# Patient Record
Sex: Female | Born: 1990 | Hispanic: No | Marital: Married | Smoking: Never smoker
Health system: Southern US, Community
[De-identification: ages and names within clinical notes are randomized; demographics above are authoritative.]

## PROBLEM LIST (undated history)

## (undated) DIAGNOSIS — I82409 Acute embolism and thrombosis of unspecified deep veins of unspecified lower extremity: Secondary | ICD-10-CM

## (undated) DIAGNOSIS — O24419 Gestational diabetes mellitus in pregnancy, unspecified control: Secondary | ICD-10-CM

## (undated) DIAGNOSIS — D6851 Activated protein C resistance: Secondary | ICD-10-CM

## (undated) DIAGNOSIS — R011 Cardiac murmur, unspecified: Secondary | ICD-10-CM

## (undated) HISTORY — PX: NO PAST SURGERIES: SHX2092

## (undated) HISTORY — DX: Gestational diabetes mellitus in pregnancy, unspecified control: O24.419

## (undated) HISTORY — DX: Cardiac murmur, unspecified: R01.1

## (undated) HISTORY — DX: Acute embolism and thrombosis of unspecified deep veins of unspecified lower extremity: I82.409

---

## 2015-07-10 DIAGNOSIS — I7300 Raynaud's syndrome without gangrene: Secondary | ICD-10-CM | POA: Diagnosis not present

## 2015-07-17 DIAGNOSIS — I7300 Raynaud's syndrome without gangrene: Secondary | ICD-10-CM | POA: Insufficient documentation

## 2015-09-25 DIAGNOSIS — Z3041 Encounter for surveillance of contraceptive pills: Secondary | ICD-10-CM | POA: Diagnosis not present

## 2016-01-22 DIAGNOSIS — L819 Disorder of pigmentation, unspecified: Secondary | ICD-10-CM | POA: Diagnosis not present

## 2016-01-22 DIAGNOSIS — H02059 Trichiasis without entropian unspecified eye, unspecified eyelid: Secondary | ICD-10-CM | POA: Diagnosis not present

## 2016-01-22 DIAGNOSIS — Z111 Encounter for screening for respiratory tuberculosis: Secondary | ICD-10-CM | POA: Diagnosis not present

## 2016-01-22 DIAGNOSIS — N926 Irregular menstruation, unspecified: Secondary | ICD-10-CM | POA: Diagnosis not present

## 2016-01-27 DIAGNOSIS — H02816 Retained foreign body in left eye, unspecified eyelid: Secondary | ICD-10-CM | POA: Diagnosis not present

## 2016-01-29 DIAGNOSIS — N926 Irregular menstruation, unspecified: Secondary | ICD-10-CM | POA: Diagnosis not present

## 2016-02-08 DIAGNOSIS — N912 Amenorrhea, unspecified: Secondary | ICD-10-CM | POA: Diagnosis not present

## 2016-02-08 DIAGNOSIS — N926 Irregular menstruation, unspecified: Secondary | ICD-10-CM | POA: Diagnosis not present

## 2016-02-15 DIAGNOSIS — N926 Irregular menstruation, unspecified: Secondary | ICD-10-CM | POA: Diagnosis not present

## 2016-08-02 ENCOUNTER — Ambulatory Visit
Admission: EM | Admit: 2016-08-02 | Discharge: 2016-08-02 | Disposition: A | Discharge status: Hospice | Payer: 59 | Attending: Family Medicine | Admitting: Family Medicine

## 2016-08-02 ENCOUNTER — Emergency Department
Admission: EM | Admit: 2016-08-02 | Discharge: 2016-08-02 | Disposition: A | Discharge status: Home / Self Care | Payer: 59 | Attending: Emergency Medicine | Admitting: Emergency Medicine

## 2016-08-02 ENCOUNTER — Encounter: Payer: Self-pay | Admitting: *Deleted

## 2016-08-02 ENCOUNTER — Encounter: Payer: Self-pay | Admitting: Emergency Medicine

## 2016-08-02 ENCOUNTER — Ambulatory Visit (INDEPENDENT_AMBULATORY_CARE_PROVIDER_SITE_OTHER)
Admit: 2016-08-02 | Discharge: 2016-08-02 | Disposition: A | Discharge status: Home / Self Care | Payer: 59 | Attending: Emergency Medicine | Admitting: Emergency Medicine

## 2016-08-02 DIAGNOSIS — M79662 Pain in left lower leg: Secondary | ICD-10-CM | POA: Diagnosis not present

## 2016-08-02 DIAGNOSIS — R2242 Localized swelling, mass and lump, left lower limb: Secondary | ICD-10-CM | POA: Diagnosis not present

## 2016-08-02 DIAGNOSIS — I82409 Acute embolism and thrombosis of unspecified deep veins of unspecified lower extremity: Secondary | ICD-10-CM | POA: Diagnosis not present

## 2016-08-02 DIAGNOSIS — I82492 Acute embolism and thrombosis of other specified deep vein of left lower extremity: Secondary | ICD-10-CM | POA: Diagnosis not present

## 2016-08-02 DIAGNOSIS — I82432 Acute embolism and thrombosis of left popliteal vein: Secondary | ICD-10-CM

## 2016-08-02 DIAGNOSIS — M79605 Pain in left leg: Secondary | ICD-10-CM | POA: Diagnosis present

## 2016-08-02 LAB — CBC
HCT: 38.9 % (ref 35.0–47.0)
HEMOGLOBIN: 13.3 g/dL (ref 12.0–16.0)
MCH: 29.7 pg (ref 26.0–34.0)
MCHC: 34.1 g/dL (ref 32.0–36.0)
MCV: 86.9 fL (ref 80.0–100.0)
Platelets: 189 10*3/uL (ref 150–440)
RBC: 4.48 MIL/uL (ref 3.80–5.20)
RDW: 13.6 % (ref 11.5–14.5)
WBC: 11.4 10*3/uL — ABNORMAL HIGH (ref 3.6–11.0)

## 2016-08-02 LAB — APTT: aPTT: <24 seconds — ABNORMAL LOW (ref 24–36)

## 2016-08-02 LAB — BASIC METABOLIC PANEL
Anion gap: 9 (ref 5–15)
BUN: 7 mg/dL (ref 6–20)
CHLORIDE: 106 mmol/L (ref 101–111)
CO2: 23 mmol/L (ref 22–32)
CREATININE: 0.74 mg/dL (ref 0.44–1.00)
Calcium: 8.9 mg/dL (ref 8.9–10.3)
GFR calc Af Amer: >60 mL/min (ref >60)
GFR calc non Af Amer: >60 mL/min (ref >60)
GLUCOSE: 92 mg/dL (ref 65–99)
Potassium: 3.9 mmol/L (ref 3.5–5.1)
SODIUM: 138 mmol/L (ref 135–145)

## 2016-08-02 LAB — POCT PREGNANCY, URINE: Preg Test, Ur: NEGATIVE

## 2016-08-02 LAB — PROTIME-INR
INR: 0.94
PROTHROMBIN TIME: 12.6 s (ref 11.4–15.2)

## 2016-08-02 MED ORDER — APIXABAN 5 MG PO TABS: 5.0000 mg | ORAL_TABLET | Freq: Two times a day (BID) | ORAL | 1 refills | Status: DC

## 2016-08-02 NOTE — ED Notes (Signed)
See triage note. Confirmed DVT to left leg, sent over from [facility].

## 2016-08-02 NOTE — ED Triage Notes (Signed)
Pt reports pain to left lower leg x1 week, sent here from [facility] with confirmed blood clot to left leg.

## 2016-08-02 NOTE — ED Triage Notes (Signed)
Left calf, knee, and foot pain x1 week. Denies injury, gradual onset.

## 2016-08-02 NOTE — ED Provider Notes (Signed)
[facility] Emergency Department Provider Note   ____________________________________________    I have reviewed the triage vital signs and the nursing notes.   HISTORY  Chief Complaint Leg Pain     HPI Hannah Patterson is a 26 y.o. female who presents with complaints of left calf pain and swelling. Patient reports 2-3 days of swelling. Was seen in urgent care, had an ultrasound which demonstrates popliteal DVT. No recent travel. She is on hormonal birth control. No shortness of breath or pleurisy. No history of DVT. No trauma. No active medical problems. No history of ICH.   History reviewed. No pertinent past medical history.  There are no active problems to display for this patient.   No past surgical history on file.  Prior to Admission medications   Medication Sig Start Date End Date Taking? Authorizing Provider  apixaban (ELIQUIS) 5 MG TABS tablet Take 1 tablet (5 mg total) by mouth 2 (two) times daily. Take 2 tablets by mouth twice daily for 7 days. Then, 1 tablet by mouth twice daily until discontinued by primary care provider 08/02/16   Jene Every, MD     Allergies Patient has no known allergies.  No family history on file.  Social History Social History  Substance Use Topics  . Smoking status: Never Smoker  . Smokeless tobacco: Never Used  . Alcohol use No    Review of Systems  Constitutional: No fever/chills  Cardiovascular: Denies chest pain. Respiratory: Denies shortness of breath.No pleurisy Gastrointestinal: No abdominal pain.  No nausea, no vomiting.    Musculoskeletal: Calf pain as above Skin: Negative for rash.   10-point ROS otherwise negative.  ____________________________________________   PHYSICAL EXAM:  VITAL SIGNS: ED Triage Vitals [08/02/16 1243]  Enc Vitals Group     BP (!) 142/88     Pulse Rate 90     Resp 14     Temp 97.5 F (36.4 C)     Temp Source Oral     SpO2 100 %     Weight  200 lb (90.7 kg)     Height 5' (1.524 m)     Head Circumference      Peak Flow      Pain Score 3     Pain Loc      Pain Edu?      Excl. in GC?     Constitutional: Alert and oriented. No acute distress. Pleasant and interactive Eyes: Conjunctivae are normal.   Nose: No congestion/rhinnorhea.  Cardiovascular: Normal rate, regular rhythm. Grossly normal heart sounds.  Good peripheral circulation. Respiratory: Normal respiratory effort.  No retractions. Lungs CTAB. Gastrointestinal: Soft and nontender. No distention.  No CVA tenderness. Genitourinary: deferred Musculoskeletal: Mild swelling left calf, mild tenderness to palpation, no significant erythema. 2+ distal pulses Warm and well perfused Neurologic:  Normal speech and language. No gross focal neurologic deficits are appreciated.  Skin:  Skin is warm, dry and intact. No rash noted. Psychiatric: Mood and affect are normal. Speech and behavior are normal.  ____________________________________________   LABS (all labs ordered are listed, but only abnormal results are displayed)  Labs Reviewed  CBC - Abnormal; Notable for the following:       Result Value   WBC 11.4 (*)    All other components within normal limits  APTT - Abnormal; Notable for the following:    aPTT <24 (*)    All other components within normal limits  BASIC METABOLIC PANEL  PROTIME-INR  POCT PREGNANCY, URINE   ____________________________________________  EKG  None ____________________________________________  RADIOLOGY  Reviewed ultrasound and confirmed popliteal DVT ____________________________________________   PROCEDURES  Procedure(s) performed: No    Critical Care performed: No ____________________________________________   INITIAL IMPRESSION / ASSESSMENT AND PLAN / ED COURSE  Pertinent labs & imaging results that were available during my care of the patient were reviewed by me and considered in my medical decision making (see  chart for details).  Lab work is reassuring. Patient has is negative. We will start the patient on eliquis as no contraindications. Had extensive discussion with patient and family regarding blood thinners and the risks, they feel comfortable after our discussion. Informed patient not to attempt to get pregnant while on eliquis and to discuss with PCP alternative forms of birth control. ____________________________________________   FINAL CLINICAL IMPRESSION(S) / ED DIAGNOSES  Final diagnoses:  Acute deep vein thrombosis (DVT) of popliteal vein of left lower extremity (HCC)      NEW MEDICATIONS STARTED DURING THIS VISIT:  Discharge Medication List as of 08/02/2016  1:59 PM    START taking these medications   Details  apixaban (ELIQUIS) 5 MG TABS tablet Take 1 tablet (5 mg total) by mouth 2 (two) times daily. Take 2 tablets by mouth twice daily for 7 days. Then, 1 tablet by mouth twice daily until discontinued by primary care provider, Starting Tue 08/02/2016, Print         Note:  This document was prepared using Dragon voice recognition software and may include unintentional dictation errors.    Jene Everyobert Tecia Cinnamon, MD 08/02/16 [phone]

## 2016-08-02 NOTE — ED Provider Notes (Signed)
CSN: [id number]     Arrival date & time 08/02/16  4098 History   First MD Initiated Contact with Patient 08/02/16 [phone]     Chief Complaint  Patient presents with  . Knee Pain  . Leg Pain  . Foot Pain   (Consider location/radiation/quality/duration/timing/severity/associated sxs/prior Treatment) HPI  26 year old female who presents with one-week history of left calf knee and foot pain. She denies any injury. It has been a gradual onset. She noticed it one night while eating dinner and had her legs tucked under the chair slightly which is her normal way of sitting. At that time it has affected her foot and ankle and she's had some numbness and tingling. She has no back pain. She denies any long car trips or airplane rides. 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 He does take birth control pills.        History reviewed. No pertinent past medical history. History reviewed. No pertinent surgical history. History reviewed. No pertinent family history. Social History  Substance Use Topics  . Smoking status: Never Smoker  . Smokeless tobacco: Never Used  . Alcohol use No   OB History    No data available     Review of Systems  Constitutional: Positive for activity change. Negative for chills and fatigue.  Musculoskeletal: Positive for myalgias.  All other systems reviewed and are negative.   Allergies  Patient has no known allergies.  Home Medications   Prior to Admission medications   Medication Sig Start Date End Date Taking? Authorizing Provider  Norgestim-Eth Estrad Triphasic (TRI-LO-MARZIA PO) Take by mouth.   Yes Historical Provider, MD   Meds Ordered and Administered this Visit  Medications - No data to display  BP 133/84 (BP Location: Left Arm)   Pulse 93   Temp 98.1 F (36.7 C) (Oral)   Resp 16   Ht 5' (1.524 m)   Wt 200 lb (90.7 kg)   LMP 07/28/2016 (Exact Date)   SpO2 100%   BMI 39.06 kg/m  No data  found.   Physical Exam  Constitutional: She is oriented to person, place, and time. She appears well-developed and well-nourished. No distress.  HENT:  Head: Normocephalic and atraumatic.  Eyes: Pupils are equal, round, and reactive to light.  Neck: Normal range of motion.  Pulmonary/Chest: Effort normal and breath sounds normal. No respiratory distress. She has no wheezes. She has no rales.  Musculoskeletal:  Calf circumference is 46cm on the left 43cm on the right. Patient has tenderness palpation of the posterior calf at midline. Extends up to the knee.  Neurological: She is alert and oriented to person, place, and time.  Skin: Skin is warm and dry. She is not diaphoretic.  Psychiatric: She has a normal mood and affect. Her behavior is normal. Judgment and thought content normal.  Nursing note and vitals reviewed.   Urgent Care Course     Procedures (including critical care time)  Labs Review Labs Reviewed - No data to display  Imaging Review No results found.   Visual Acuity Review  Right Eye Distance:   Left Eye Distance:   Bilateral Distance:    Right Eye Near:   Left Eye Near:    Bilateral Near:         MDM   1. DVT (deep venous thrombosis) (HCC)    Reviewed the ultrasound report with the patient and her father we will refer them over to [facility] emergency  department for further evaluation and treatment. All questions were answered.Patient left in privately owned vehicle in stable condition.    Lutricia FeilWilliam P Renold Kozar, PA-C 08/02/16 1212

## 2016-08-04 ENCOUNTER — Telehealth: Payer: Self-pay

## 2016-08-04 NOTE — Telephone Encounter (Signed)
Courtesy call back completed today for patient's recent visit at [facility]. Patient did not answer, left message on machine to call back with any questions or concerns.   

## 2016-08-05 DIAGNOSIS — Z3009 Encounter for other general counseling and advice on contraception: Secondary | ICD-10-CM | POA: Diagnosis not present

## 2016-08-05 DIAGNOSIS — Z7901 Long term (current) use of anticoagulants: Secondary | ICD-10-CM | POA: Diagnosis not present

## 2016-08-05 DIAGNOSIS — I82432 Acute embolism and thrombosis of left popliteal vein: Secondary | ICD-10-CM | POA: Diagnosis not present

## 2016-08-17 ENCOUNTER — Inpatient Hospital Stay: Payer: 59

## 2016-08-17 ENCOUNTER — Inpatient Hospital Stay: Payer: 59 | Attending: Hematology and Oncology | Admitting: Hematology and Oncology

## 2016-08-17 ENCOUNTER — Encounter: Payer: Self-pay | Admitting: Hematology and Oncology

## 2016-08-17 VITALS — BP 122/83 | HR 80 | Temp 98.1°F | Resp 18 | Wt 206.4 lb

## 2016-08-17 DIAGNOSIS — D6861 Antiphospholipid syndrome: Secondary | ICD-10-CM | POA: Insufficient documentation

## 2016-08-17 DIAGNOSIS — Z8249 Family history of ischemic heart disease and other diseases of the circulatory system: Secondary | ICD-10-CM | POA: Insufficient documentation

## 2016-08-17 DIAGNOSIS — Z7901 Long term (current) use of anticoagulants: Secondary | ICD-10-CM | POA: Insufficient documentation

## 2016-08-17 DIAGNOSIS — I82432 Acute embolism and thrombosis of left popliteal vein: Secondary | ICD-10-CM | POA: Diagnosis not present

## 2016-08-17 LAB — COMPREHENSIVE METABOLIC PANEL
ALT: 18 U/L (ref 14–54)
AST: 21 U/L (ref 15–41)
Albumin: 4.2 g/dL (ref 3.5–5.0)
Alkaline Phosphatase: 59 U/L (ref 38–126)
Anion gap: 8 (ref 5–15)
BUN: 6 mg/dL (ref 6–20)
CO2: 24 mmol/L (ref 22–32)
Calcium: 9 mg/dL (ref 8.9–10.3)
Chloride: 101 mmol/L (ref 101–111)
Creatinine, Ser: 0.67 mg/dL (ref 0.44–1.00)
GFR calc Af Amer: >60 mL/min (ref >60)
GFR calc non Af Amer: >60 mL/min (ref >60)
Glucose, Bld: 86 mg/dL (ref 65–99)
Potassium: 3.4 mmol/L — ABNORMAL LOW (ref 3.5–5.1)
Sodium: 133 mmol/L — ABNORMAL LOW (ref 135–145)
Total Bilirubin: 0.7 mg/dL (ref 0.3–1.2)
Total Protein: 7.7 g/dL (ref 6.5–8.1)

## 2016-08-17 LAB — CBC WITH DIFFERENTIAL/PLATELET
Basophils Absolute: 0.1 10*3/uL (ref 0–0.1)
Basophils Relative: 1 %
Eosinophils Absolute: 0.2 10*3/uL (ref 0–0.7)
Eosinophils Relative: 3 %
HCT: 41.7 % (ref 35.0–47.0)
Hemoglobin: 13.8 g/dL (ref 12.0–16.0)
Lymphocytes Relative: 27 %
Lymphs Abs: 2 10*3/uL (ref 1.0–3.6)
MCH: 28.9 pg (ref 26.0–34.0)
MCHC: 33.1 g/dL (ref 32.0–36.0)
MCV: 87.3 fL (ref 80.0–100.0)
Monocytes Absolute: 0.5 10*3/uL (ref 0.2–0.9)
Monocytes Relative: 7 %
Neutro Abs: 4.8 10*3/uL (ref 1.4–6.5)
Neutrophils Relative %: 62 %
Platelets: 300 10*3/uL (ref 150–440)
RBC: 4.77 MIL/uL (ref 3.80–5.20)
RDW: 13.7 % (ref 11.5–14.5)
WBC: 7.6 10*3/uL (ref 3.6–11.0)

## 2016-08-17 NOTE — Progress Notes (Signed)
{  Patient here today as new evaluation regarding DVT in LLE.  Referred by Dr. Harrington Challengerhies.  Patient states her maternal grandmother has DVTs and her paternal grandmother had several miscarriages.

## 2016-08-17 NOTE — Progress Notes (Signed)
[facility] day:  08/17/2016  Chief Complaint: Hannah Patterson is a 26 y.o. female with a left lower extremity DVT who is referred in consultation by Dr. Raechel Ache for assessment and management.  HPI:  The patient notes left calf pain in 05/2016 like she "pulled a muscle". She took Tylenol. On 07/26/2016, she states that the pain was worse. She used Tylenol, heating pad, and stretching. On 08/02/2016 while at work, pain was excruciating. She denied any trauma.  She went to the urgent care.   Left lower extremity duplex on 08/02/2016 revealed an acute deep venous thrombosis involving the left popliteal and posterior tibial veins.  She was transferred to the [facility] ER.  She was started on Eliquis.  She took 10 mg BID x 1 week and then 5 mg BID.  She denies any excess bruising, but notes that her menses lasted longer.  Labs on 08/02/2016 revealed a hematocrit 38.6, hemoglobin 13.3, MCV 86.9, platelets 189,000 and white count 11,400.  Basic metabolic panel included a creatinine of 0.74.  PT was 12.6 (INR 0.94) and PTT < 24.  She denies any immobility, long car or plane rides.  She denies any dehydration.  She was on birth control pills for 2 years.  She is now off.  She denies any B symptoms.  She denies any medical problems except for Raynaud's.  She does not smoke.  Family history is notable for a maternal grandmother who died of pulmonary embolism in her 104s.  Her paternal grandmothergrandmother had a miscarriage 2. A half maternal aunt has lupus.   Past Medical History:  Diagnosis Date  . DVT (deep venous thrombosis) ([place])   . Heart murmur     History reviewed. No pertinent surgical history.  Family History  Problem Relation Age of Onset  . Cancer Maternal Aunt     Social History:  reports that she has never smoked. She has never used smokeless tobacco. She reports that she does not drink alcohol. Her drug history is not on file.  She is a  Animal nutritionist for [employer].  She lives in [place].  The patient is alone today.  Allergies: No Known Allergies  Current Medications: Current Outpatient Prescriptions  Medication Sig Dispense Refill  . apixaban (ELIQUIS) 5 MG TABS tablet Take 1 tablet (5 mg total) by mouth 2 (two) times daily. Take 2 tablets by mouth twice daily for 7 days. Then, 1 tablet by mouth twice daily until discontinued by primary care provider 60 tablet 1   No current facility-administered medications for this visit.     Review of Systems:  GENERAL:  Feels good.  Active.  No fevers, sweats or weight loss. PERFORMANCE STATUS (ECOG):  0 HEENT:  No visual changes, runny nose, sore throat, mouth sores or tenderness. Lungs: No shortness of breath or cough.  No hemoptysis. Cardiac:  No chest pain, palpitations, orthopnea, or PND. GI:  No nausea, vomiting, diarrhea, constipation, melena or hematochezia. Heavy menses x 1. GU:  No urgency, frequency, dysuria, or hematuria. Musculoskeletal:  No back pain.  No joint pain.  No muscle tenderness. Extremities:  Left lower extremity swelling.  No pain. Skin:  No rashes or skin changes. Neuro:  Headache.  No numbness or weakness, balance or coordination issues. Endocrine:  No diabetes, thyroid issues, hot flashes or night sweats. Psych:  No mood changes, depression or anxiety. Pain:  No focal pain. Review of systems:  All other systems reviewed  and found to be negative.  Physical Exam: Blood pressure 122/83, pulse 80, temperature 98.1 F (36.7 C), temperature source Tympanic, resp. rate 18, weight 206 lb 5.6 oz (93.6 kg), last menstrual period 08/02/2016. GENERAL:  Well developed, well nourished, heavyset woman sitting comfortably in the exam room in no acute distress. MENTAL STATUS:  Alert and oriented to person, place and time. HEAD:  Long dark brown hair.  Normocephalic, atraumatic, face symmetric, no Cushingoid features. EYES:  Brown eyes.  Pupils equal round  and reactive to light and accomodation.  No conjunctivitis or scleral icterus. ENT:  Oropharynx clear without lesion.  Tongue normal. Mucous membranes moist.  RESPIRATORY:  Clear to auscultation without rales, wheezes or rhonchi. CARDIOVASCULAR:  Regular rate and rhythm without murmur, rub or gallop. ABDOMEN:  Soft, non-tender, with active bowel sounds, and no hepatosplenomegaly.  No masses. SKIN:  No rashes, ulcers or lesions. EXTREMITIES: 4 cm circumferential difference between left and right calf.  No skin discoloration or tenderness.  No palpable cords. LYMPH NODES: No palpable cervical, supraclavicular, axillary or inguinal adenopathy  NEUROLOGICAL: Unremarkable. PSYCH:  Appropriate.   No visits with results within 3 Day(s) from this visit.  Latest known visit with results is:  Admission on 08/02/2016, Discharged on 08/02/2016  Component Date Value Ref Range Status  . WBC 08/02/2016 11.4* 3.6 - 11.0 K/uL Final  . RBC 08/02/2016 4.48  3.80 - 5.20 MIL/uL Final  . Hemoglobin 08/02/2016 13.3  12.0 - 16.0 g/dL Final  . HCT 08/02/2016 38.9  35.0 - 47.0 % Final  . MCV 08/02/2016 86.9  80.0 - 100.0 fL Final  . MCH 08/02/2016 29.7  26.0 - 34.0 pg Final  . MCHC 08/02/2016 34.1  32.0 - 36.0 g/dL Final  . RDW 08/02/2016 13.6  11.5 - 14.5 % Final  . Platelets 08/02/2016 189  150 - 440 K/uL Final  . Sodium 08/02/2016 138  135 - 145 mmol/L Final  . Potassium 08/02/2016 3.9  3.5 - 5.1 mmol/L Final  . Chloride 08/02/2016 106  101 - 111 mmol/L Final  . CO2 08/02/2016 23  22 - 32 mmol/L Final  . Glucose, Bld 08/02/2016 92  65 - 99 mg/dL Final  . BUN 08/02/2016 7  6 - 20 mg/dL Final  . Creatinine, Ser 08/02/2016 0.74  0.44 - 1.00 mg/dL Final  . Calcium 08/02/2016 8.9  8.9 - 10.3 mg/dL Final  . GFR calc non Af Amer 08/02/2016 >60  >60 mL/min Final  . GFR calc Af Amer 08/02/2016 >60  >60 mL/min Final   Comment: (NOTE) The eGFR has been calculated using the CKD EPI equation. This calculation has  not been validated in all clinical situations. eGFR's persistently <60 mL/min signify possible Chronic Kidney Disease.   . Anion gap 08/02/2016 9  5 - 15 Final  . Prothrombin Time 08/02/2016 12.6  11.4 - 15.2 seconds Final  . INR 08/02/2016 0.94   Final  . aPTT 08/02/2016 <24* 24 - 36 seconds Final  . Preg Test, Ur 08/02/2016 NEGATIVE  NEGATIVE Final   Comment:        THE SENSITIVITY OF THIS METHODOLOGY IS >24 mIU/mL     Assessment:  Mayreli Robyn Vanosdol is a 26 y.o. female with a left lower extremity DVT while on birth control pills.  Left lower extremity duplex on 08/02/2016 revealed an acute deep venous thrombosis involving the left popliteal and posterior tibial veins.  She is on Eliquis.  She denies any immobility, long car or plane  rides.  She denies any dehydration.  She was on birth control pills for 2 years (discontinued). She does not smoke.  There is a family history of thrombosis (maternal grandmother, paternal grandmother).  A maternal aunt has lupus.  Labs on 08/02/2016 revealed a a normal CBC, BMP, PT.  PTT was < 24 (low).  Symptomatically, she denies any B symptoms.  She described Raynaud's symptoms in 05/2016.  Exam reveals left lower extremity edema.  Plan: 1.  Discuss diagnosis and management of DVT.  Discuss risk factors for thrombosis.  She was on birth control pills, now discontinued.  She is overweight.  Discuss risk factors for thrombosis as rocks in a basket. Enough rocks and the basket sinks.  Sinking represents thrombosis.  Discuss limiting risk factors that she can control.  Discuss a hypercoagulable work-up.    Discuss initial low PTT which may be the result of sample collection. Clotting factors may be increased or activated in vivo resulting in shortening of clotting times.  Discuss repeat PTT.  If short PTT persists on recollection, this may result in an increased risk of thrombosis, recurrent thrombosis, or miscarriage.  Discuss plan for initial  anticoagulation for 6 months unless work-up reveals an abnormality such as a lupus anticoagulant.  Discuss 2 positive tests 3 months apart for a lupus anticoagulant/antiphospholipid antibody syndrome.  Test may be affected by Eliquis.  2.  Labs today:  CBC with diff, CMP, Factor V Leiden, prothrombin gene mutation, PTT, lupus anticoagulant panel, beta-2 glycoprotein antibodies, anti-cardiolipin antibodies. 3.  Anticipate additional testing in future:  protein C, protein S, and ATIII. 4.  RTC in 1 month for MD assessment and labs (CBC with diff, BMP).   Lequita Asal, MD  08/17/2016, 3:21 PM

## 2016-08-18 LAB — APTT: aPTT: 36 seconds (ref 24–36)

## 2016-08-19 LAB — LUPUS ANTICOAGULANT PANEL
DRVVT: 63.4 s — ABNORMAL HIGH (ref 0.0–47.0)
PTT Lupus Anticoagulant: 43.6 s (ref 0.0–51.9)

## 2016-08-19 LAB — CARDIOLIPIN ANTIBODIES, IGG, IGM, IGA
Anticardiolipin IgA: <9 APL U/mL (ref 0–11)
Anticardiolipin IgG: <9 GPL U/mL (ref 0–14)
Anticardiolipin IgM: 14 MPL U/mL — ABNORMAL HIGH (ref 0–12)

## 2016-08-19 LAB — BETA-2-GLYCOPROTEIN I ABS, IGG/M/A
Beta-2 Glyco I IgG: <9 GPI IgG units (ref 0–20)
Beta-2-Glycoprotein I IgA: <9 GPI IgA units (ref 0–25)
Beta-2-Glycoprotein I IgM: <9 GPI IgM units (ref 0–32)

## 2016-08-19 LAB — DRVVT MIX: dRVVT Mix: 47.4 s — ABNORMAL HIGH (ref 0.0–47.0)

## 2016-08-19 LAB — DRVVT CONFIRM: dRVVT Confirm: 1.2 ratio (ref 0.8–1.2)

## 2016-08-22 LAB — PROTHROMBIN GENE MUTATION

## 2016-08-23 LAB — FACTOR 5 LEIDEN

## 2016-08-25 DIAGNOSIS — D6851 Activated protein C resistance: Secondary | ICD-10-CM | POA: Insufficient documentation

## 2016-08-28 DIAGNOSIS — E669 Obesity, unspecified: Secondary | ICD-10-CM | POA: Diagnosis present

## 2016-09-13 ENCOUNTER — Telehealth: Payer: Self-pay | Admitting: *Deleted

## 2016-09-13 NOTE — Telephone Encounter (Signed)
Erroneous entry

## 2016-09-14 ENCOUNTER — Telehealth: Payer: Self-pay | Admitting: *Deleted

## 2016-09-14 ENCOUNTER — Inpatient Hospital Stay: Payer: 59

## 2016-09-14 ENCOUNTER — Inpatient Hospital Stay: Payer: 59 | Attending: Hematology and Oncology | Admitting: Hematology and Oncology

## 2016-09-14 ENCOUNTER — Ambulatory Visit: Payer: 59 | Admitting: Hematology and Oncology

## 2016-09-14 ENCOUNTER — Encounter: Payer: Self-pay | Admitting: Hematology and Oncology

## 2016-09-14 VITALS — BP 119/80 | HR 83 | Temp 98.8°F | Resp 18 | Wt 207.9 lb

## 2016-09-14 DIAGNOSIS — Z79899 Other long term (current) drug therapy: Secondary | ICD-10-CM | POA: Diagnosis not present

## 2016-09-14 DIAGNOSIS — Z7901 Long term (current) use of anticoagulants: Secondary | ICD-10-CM | POA: Insufficient documentation

## 2016-09-14 DIAGNOSIS — I82432 Acute embolism and thrombosis of left popliteal vein: Secondary | ICD-10-CM | POA: Diagnosis not present

## 2016-09-14 DIAGNOSIS — Z8249 Family history of ischemic heart disease and other diseases of the circulatory system: Secondary | ICD-10-CM | POA: Insufficient documentation

## 2016-09-14 DIAGNOSIS — D6851 Activated protein C resistance: Secondary | ICD-10-CM | POA: Insufficient documentation

## 2016-09-14 LAB — CBC WITH DIFFERENTIAL/PLATELET
Basophils Absolute: 0 10*3/uL (ref 0–0.1)
Basophils Relative: 1 %
Eosinophils Absolute: 0.3 10*3/uL (ref 0–0.7)
Eosinophils Relative: 5 %
HCT: 39.8 % (ref 35.0–47.0)
Hemoglobin: 13.4 g/dL (ref 12.0–16.0)
Lymphocytes Relative: 27 %
Lymphs Abs: 2 10*3/uL (ref 1.0–3.6)
MCH: 28.9 pg (ref 26.0–34.0)
MCHC: 33.6 g/dL (ref 32.0–36.0)
MCV: 86 fL (ref 80.0–100.0)
Monocytes Absolute: 0.6 10*3/uL (ref 0.2–0.9)
Monocytes Relative: 8 %
Neutro Abs: 4.3 10*3/uL (ref 1.4–6.5)
Neutrophils Relative %: 59 %
Platelets: 291 10*3/uL (ref 150–440)
RBC: 4.63 MIL/uL (ref 3.80–5.20)
RDW: 13.4 % (ref 11.5–14.5)
WBC: 7.3 10*3/uL (ref 3.6–11.0)

## 2016-09-14 LAB — BASIC METABOLIC PANEL
Anion gap: 6 (ref 5–15)
BUN: 8 mg/dL (ref 6–20)
CO2: 23 mmol/L (ref 22–32)
Calcium: 9 mg/dL (ref 8.9–10.3)
Chloride: 102 mmol/L (ref 101–111)
Creatinine, Ser: 0.62 mg/dL (ref 0.44–1.00)
GFR calc Af Amer: >60 mL/min (ref >60)
GFR calc non Af Amer: >60 mL/min (ref >60)
Glucose, Bld: 86 mg/dL (ref 65–99)
Potassium: 3.5 mmol/L (ref 3.5–5.1)
Sodium: 131 mmol/L — ABNORMAL LOW (ref 135–145)

## 2016-09-14 NOTE — Telephone Encounter (Signed)
Called patient and LVM that sodium is a little low.  Lab results have been sent to PCP for review.  Instructed patient to call PCP if she does not hear from them in a day or so.  Also advised her to eat something salty.

## 2016-09-14 NOTE — Progress Notes (Signed)
Patient here for lab results.

## 2016-09-14 NOTE — Telephone Encounter (Signed)
-----   Message from Rosey Bath, MD sent at 09/14/2016  3:54 PM EDT ----- Regarding: Please call patient.  FAX labs to PCP  Sodium a little low.  M  ----- Message ----- From: Interface, Lab In [place] Sent: 09/14/2016   2:59 PM To: Rosey Bath, MD

## 2016-09-14 NOTE — Progress Notes (Signed)
[facility] day:  09/14/2016  Chief Complaint: Hannah Patterson is a 26 y.o. female with a left lower extremity DVT who is seen for review of work-up and discussion regarding direction of therapy.  HPI:  The patient was last seen in the hematology clinic on 08/17/2016.  At that time, she was seen for initial consultation.  She had a left lower extremity DVT while on birth control pills.  She was on Eliquis.  Outside labs revealed a normal CBC.  PTT was low (< 24).  She underwent a hypercoagulable work-up.  The following labs were normal:  CBC with diff, CMP, PTT (36), lupus anticoagulant panel, beta-2 glycoprotein antibodies, and prothrombin gene mutation.  Factor V Leiden revealed a single R506Q mutation.  Anticardiolipin IgM was indeterminate (14).  During the interim, she notes no more pain.  Her leg is still swollen.  She is taking Eliquis 5 mg BID.  She states that she is going to [place] in 12/2016.   Past Medical History:  Diagnosis Date  . DVT (deep venous thrombosis) ([place])   . Heart murmur     History reviewed. No pertinent surgical history.  Family History  Problem Relation Age of Onset  . Cancer Maternal Aunt     Social History:  reports that she has never smoked. She has never used smokeless tobacco. She reports that she does not drink alcohol. Her drug history is not on file.  She is a Animal nutritionist for [employer].  She lives in [place].  She is going to [place] in 12/2016.  The patient is accompanied by her husband and mother-in-law today.  Allergies: No Known Allergies  Current Medications: Current Outpatient Prescriptions  Medication Sig Dispense Refill  . apixaban (ELIQUIS) 5 MG TABS tablet Take 1 tablet (5 mg total) by mouth 2 (two) times daily. Take 2 tablets by mouth twice daily for 7 days. Then, 1 tablet by mouth twice daily until discontinued by primary care provider 60 tablet 1   No current  facility-administered medications for this visit.     Review of Systems:  GENERAL:  Feels good.  No fevers or sweats.  Weight up 1 pound. PERFORMANCE STATUS (ECOG):  0 HEENT:  No visual changes, runny nose, sore throat, mouth sores or tenderness. Lungs: No shortness of breath or cough.  No hemoptysis. Cardiac:  No chest pain, palpitations, orthopnea, or PND. GI:  No nausea, vomiting, diarrhea, constipation, melena or hematochezia. Heavy menses x 1. GU:  No urgency, frequency, dysuria, or hematuria. Musculoskeletal:  No back pain.  No joint pain.  No muscle tenderness. Extremities:  Left lower extremity swelling.  No pain. Skin:  No rashes or skin changes. Neuro:  Headache.  No numbness or weakness, balance or coordination issues. Endocrine:  No diabetes, thyroid issues, hot flashes or night sweats. Psych:  No mood changes, depression or anxiety. Pain:  No focal pain. Review of systems:  All other systems reviewed and found to be negative.  Physical Exam: Blood pressure 119/80, pulse 83, temperature 98.8 F (37.1 C), temperature source Tympanic, resp. rate 18, weight 207 lb 14.3 oz (94.3 kg). GENERAL:  Well developed, well nourished, heavyset woman sitting comfortably in the exam room in no acute distress. MENTAL STATUS:  Alert and oriented to person, place and time. HEAD:  Long dark brown hair.  Normocephalic, atraumatic, face symmetric, no Cushingoid features. EYES:  Brown eyes. No conjunctivitis or scleral icterus. NEUROLOGICAL: Unremarkable. PSYCH:  Appropriate.   Appointment on 09/14/2016  Component Date Value Ref Range Status  . Sodium 09/14/2016 131* 135 - 145 mmol/L Final  . Potassium 09/14/2016 3.5  3.5 - 5.1 mmol/L Final  . Chloride 09/14/2016 102  101 - 111 mmol/L Final  . CO2 09/14/2016 23  22 - 32 mmol/L Final  . Glucose, Bld 09/14/2016 86  65 - 99 mg/dL Final  . BUN 09/14/2016 8  6 - 20 mg/dL Final  . Creatinine, Ser 09/14/2016 0.62  0.44 - 1.00 mg/dL Final  .  Calcium 09/14/2016 9.0  8.9 - 10.3 mg/dL Final  . GFR calc non Af Amer 09/14/2016 >60  >60 mL/min Final  . GFR calc Af Amer 09/14/2016 >60  >60 mL/min Final   Comment: (NOTE) The eGFR has been calculated using the CKD EPI equation. This calculation has not been validated in all clinical situations. eGFR's persistently <60 mL/min signify possible Chronic Kidney Disease.   . Anion gap 09/14/2016 6  5 - 15 Final  . WBC 09/14/2016 7.3  3.6 - 11.0 K/uL Final  . RBC 09/14/2016 4.63  3.80 - 5.20 MIL/uL Final  . Hemoglobin 09/14/2016 13.4  12.0 - 16.0 g/dL Final  . HCT 09/14/2016 39.8  35.0 - 47.0 % Final  . MCV 09/14/2016 86.0  80.0 - 100.0 fL Final  . MCH 09/14/2016 28.9  26.0 - 34.0 pg Final  . MCHC 09/14/2016 33.6  32.0 - 36.0 g/dL Final  . RDW 09/14/2016 13.4  11.5 - 14.5 % Final  . Platelets 09/14/2016 291  150 - 440 K/uL Final  . Neutrophils Relative % 09/14/2016 59  % Final  . Neutro Abs 09/14/2016 4.3  1.4 - 6.5 K/uL Final  . Lymphocytes Relative 09/14/2016 27  % Final  . Lymphs Abs 09/14/2016 2.0  1.0 - 3.6 K/uL Final  . Monocytes Relative 09/14/2016 8  % Final  . Monocytes Absolute 09/14/2016 0.6  0.2 - 0.9 K/uL Final  . Eosinophils Relative 09/14/2016 5  % Final  . Eosinophils Absolute 09/14/2016 0.3  0 - 0.7 K/uL Final  . Basophils Relative 09/14/2016 1  % Final  . Basophils Absolute 09/14/2016 0.0  0 - 0.1 K/uL Final    Assessment:  Hannah Patterson is a 26 y.o. female with a left lower extremity DVT while on birth control pills.  She is heterozygous for Factor V Leiden (single R506Q mutation).  Left lower extremity duplex on 08/02/2016 revealed an acute deep venous thrombosis involving the left popliteal and posterior tibial veins.  She is on Eliquis.  She denies any immobility, long car or plane rides.  She denies any dehydration.  She was on birth control pills for 2 years (discontinued). She does not smoke.  There is a family history of thrombosis (maternal grandmother,  paternal grandmother).  A maternal aunt has lupus.  Labs on 08/02/2016 revealed a a normal CBC, BMP, PT.  PTT was < 24 (low).  Work-up on 08/17/2016 revealed Factor V Leiden (single R506Q mutation).  The following labs were normal:  CBC with diff, CMP, PTT (36), lupus anticoagulant panel, beta-2 glycoprotein antibodies, and prothrombin gene mutation.  Anticardiolipin IgM was indeterminate (14).  Symptomatically, she denies any B symptoms.  She described Raynaud's symptoms in 05/2016.  Exam reveals left lower extremity edema.  Plan: 1.  Review hypercoagulable work-up.  She is heterozygous for Factor V Leiden (1 copy of mutation).  Review genetics.  Review increased risk of thrombosis 4-8 x over general population.  Discuss unclear significance of anticardiolipin IgM.  Discuss plan to repeat testing in 3 months.  Add additional testing at that time (protein C, S, and AT III).  Anticipate minimum of 6 months of anticoagulation.  Multiple questions were asked and answered. 2.  Continue Eliquis. 3.  RTC week of 11/21/2016 for labs (CBC with diff, CMP, lupus anticoagulant, anticardiolipin antibodies, beta2 glycoprotein, protein C antigen/activity, protein S antigen/activity, ATIII antigen/activity). 4.  RTC on 12/07/2016 for MD assessment and review of work-up.   Lequita Asal, MD  09/14/2016, 3:27 PM

## 2016-09-19 ENCOUNTER — Encounter: Payer: Self-pay | Admitting: Hematology and Oncology

## 2016-09-20 ENCOUNTER — Other Ambulatory Visit: Payer: Self-pay | Admitting: *Deleted

## 2016-09-20 MED ORDER — APIXABAN 5 MG PO TABS
5.0000 mg | ORAL_TABLET | Freq: Two times a day (BID) | ORAL | 1 refills | Status: DC
Start: 2016-09-20 — End: 2016-12-21

## 2016-09-30 ENCOUNTER — Encounter: Payer: Self-pay | Admitting: Hematology and Oncology

## 2016-10-07 DIAGNOSIS — D6851 Activated protein C resistance: Secondary | ICD-10-CM | POA: Insufficient documentation

## 2016-11-21 ENCOUNTER — Inpatient Hospital Stay: Payer: 59 | Attending: Hematology and Oncology

## 2016-11-21 DIAGNOSIS — Z7901 Long term (current) use of anticoagulants: Secondary | ICD-10-CM | POA: Diagnosis not present

## 2016-11-21 DIAGNOSIS — Z8249 Family history of ischemic heart disease and other diseases of the circulatory system: Secondary | ICD-10-CM | POA: Diagnosis not present

## 2016-11-21 DIAGNOSIS — Z79899 Other long term (current) drug therapy: Secondary | ICD-10-CM | POA: Insufficient documentation

## 2016-11-21 DIAGNOSIS — I82432 Acute embolism and thrombosis of left popliteal vein: Secondary | ICD-10-CM | POA: Diagnosis not present

## 2016-11-21 DIAGNOSIS — D6851 Activated protein C resistance: Secondary | ICD-10-CM | POA: Diagnosis not present

## 2016-11-21 LAB — COMPREHENSIVE METABOLIC PANEL
ALT: 24 U/L (ref 14–54)
AST: 27 U/L (ref 15–41)
Albumin: 4.5 g/dL (ref 3.5–5.0)
Alkaline Phosphatase: 60 U/L (ref 38–126)
Anion gap: 7 (ref 5–15)
BUN: 10 mg/dL (ref 6–20)
CO2: 26 mmol/L (ref 22–32)
Calcium: 9.3 mg/dL (ref 8.9–10.3)
Chloride: 104 mmol/L (ref 101–111)
Creatinine, Ser: 0.72 mg/dL (ref 0.44–1.00)
GFR calc Af Amer: >60 mL/min (ref >60)
GFR calc non Af Amer: >60 mL/min (ref >60)
Glucose, Bld: 96 mg/dL (ref 65–99)
Potassium: 3.7 mmol/L (ref 3.5–5.1)
Sodium: 137 mmol/L (ref 135–145)
Total Bilirubin: 0.6 mg/dL (ref 0.3–1.2)
Total Protein: 8.2 g/dL — ABNORMAL HIGH (ref 6.5–8.1)

## 2016-11-21 LAB — CBC WITH DIFFERENTIAL/PLATELET
Basophils Absolute: 0.1 10*3/uL (ref 0–0.1)
Basophils Relative: 1 %
Eosinophils Absolute: 0.5 10*3/uL (ref 0–0.7)
Eosinophils Relative: 8 %
HCT: 43.3 % (ref 35.0–47.0)
Hemoglobin: 14.3 g/dL (ref 12.0–16.0)
Lymphocytes Relative: 31 %
Lymphs Abs: 1.9 10*3/uL (ref 1.0–3.6)
MCH: 28.3 pg (ref 26.0–34.0)
MCHC: 33.1 g/dL (ref 32.0–36.0)
MCV: 85.5 fL (ref 80.0–100.0)
Monocytes Absolute: 0.5 10*3/uL (ref 0.2–0.9)
Monocytes Relative: 8 %
Neutro Abs: 3.2 10*3/uL (ref 1.4–6.5)
Neutrophils Relative %: 52 %
Platelets: 300 10*3/uL (ref 150–440)
RBC: 5.07 MIL/uL (ref 3.80–5.20)
RDW: 14.2 % (ref 11.5–14.5)
WBC: 6.1 10*3/uL (ref 3.6–11.0)

## 2016-11-22 LAB — LUPUS ANTICOAGULANT PANEL
DRVVT: 43.4 s (ref 0.0–47.0)
PTT Lupus Anticoagulant: 41.4 s (ref 0.0–51.9)

## 2016-11-22 LAB — PROTEIN S, TOTAL: Protein S Ag, Total: 80 % (ref 60–150)

## 2016-11-22 LAB — CARDIOLIPIN ANTIBODIES, IGG, IGM, IGA
Anticardiolipin IgA: <9 APL U/mL (ref 0–11)
Anticardiolipin IgG: <9 GPL U/mL (ref 0–14)
Anticardiolipin IgM: <9 MPL U/mL (ref 0–12)

## 2016-11-22 LAB — ANTITHROMBIN III: AntiThromb III Func: 104 % (ref 75–120)

## 2016-11-22 LAB — PROTEIN C, TOTAL: Protein C, Total: 120 % (ref 60–150)

## 2016-11-23 LAB — BETA-2-GLYCOPROTEIN I ABS, IGG/M/A
Beta-2 Glyco I IgG: <9 GPI IgG units (ref 0–20)
Beta-2-Glycoprotein I IgA: <9 GPI IgA units (ref 0–25)
Beta-2-Glycoprotein I IgM: <9 GPI IgM units (ref 0–32)

## 2016-12-07 ENCOUNTER — Encounter: Payer: Self-pay | Admitting: Hematology and Oncology

## 2016-12-07 ENCOUNTER — Inpatient Hospital Stay: Payer: 59 | Attending: Hematology and Oncology | Admitting: Hematology and Oncology

## 2016-12-07 ENCOUNTER — Inpatient Hospital Stay: Payer: 59

## 2016-12-07 VITALS — BP 117/79 | HR 79 | Temp 98.5°F | Resp 18 | Wt 201.5 lb

## 2016-12-07 DIAGNOSIS — Z7901 Long term (current) use of anticoagulants: Secondary | ICD-10-CM | POA: Diagnosis not present

## 2016-12-07 DIAGNOSIS — I82432 Acute embolism and thrombosis of left popliteal vein: Secondary | ICD-10-CM | POA: Insufficient documentation

## 2016-12-07 DIAGNOSIS — D6852 Prothrombin gene mutation: Secondary | ICD-10-CM | POA: Insufficient documentation

## 2016-12-07 DIAGNOSIS — Z8249 Family history of ischemic heart disease and other diseases of the circulatory system: Secondary | ICD-10-CM | POA: Insufficient documentation

## 2016-12-07 DIAGNOSIS — D6851 Activated protein C resistance: Secondary | ICD-10-CM | POA: Insufficient documentation

## 2016-12-07 NOTE — Progress Notes (Signed)
[facility] day:  12/07/2016  Chief Complaint: Hannah Patterson is a 26 y.o. female with a left lower extremity DVT who is seen for review of additional work-up and 3 month assessment  HPI:  The patient was last seen in the hematology clinic on 09/14/2016.  At that time, she had persistent left lower extremity edema, but denied any leg pain.  She was taking Eliquis 5 mg BID.  She stated that she is going to [place] in 12/2016.  Work-up on 11/22/2016 revealed the following normal labs:  protein C total (120%), protein S total (80%), AT III activity (104%), lupus anticoagulant panel, anticardiolipin antibodies, beta-2 glycoprotein antibodies.  CBC and CMP were normal.  Symptomatically, she denies any complaints.  She notes some varicose veins.   Past Medical History:  Diagnosis Date  . DVT (deep venous thrombosis) ([place])   . Heart murmur     History reviewed. No pertinent surgical history.  Family History  Problem Relation Age of Onset  . Cancer Maternal Aunt     Social History:  reports that she has never smoked. She has never used smokeless tobacco. She reports that she does not drink alcohol. Her drug history is not on file.  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  She is going to [place] in 12/2016.   Allergies: No Known Allergies  Current Medications: Current Outpatient Prescriptions  Medication Sig Dispense Refill  . apixaban (ELIQUIS) 5 MG TABS tablet Take 1 tablet (5 mg total) by mouth 2 (two) times daily. Take 2 tablets by mouth twice daily for 7 days. Then, 1 tablet by mouth twice daily until discontinued by primary care provider 60 tablet 1   No current facility-administered medications for this visit.     Review of Systems:  GENERAL:  Feels good.  No fevers or sweats.  Weight down 6 pounds. PERFORMANCE STATUS (ECOG):  0 HEENT:  No visual changes, runny nose, sore throat, mouth sores or  tenderness. Lungs: No shortness of breath or cough.  No hemoptysis. Cardiac:  No chest pain, palpitations, orthopnea, or PND. GI:  No nausea, vomiting, diarrhea, constipation, melena or hematochezia. Heavy menses x 1. GU:  No urgency, frequency, dysuria, or hematuria. Musculoskeletal:  No back pain.  No joint pain.  No muscle tenderness. Extremities:  Left lower extremity swelling.  Varicose veins.  No pain. Skin:  No rashes or skin changes. Neuro:  No headache, numbness or weakness, balance or coordination issues. Endocrine:  No diabetes, thyroid issues, hot flashes or night sweats. Psych:  No mood changes, depression or anxiety. Pain:  No focal pain. Review of systems:  All other systems reviewed and found to be negative.  Physical Exam: Blood pressure 117/79, pulse 79, temperature 98.5 F (36.9 C), temperature source Tympanic, resp. rate 18, weight 201 lb 8 oz (91.4 kg). GENERAL:  Well developed, well nourished, heavyset woman sitting comfortably in the exam room in no acute distress. MENTAL STATUS:  Alert and oriented to person, place and time. HEAD:  Long dark brown hair.  Normocephalic, atraumatic, face symmetric, no Cushingoid features. EYES:  Brown eyes. No conjunctivitis or scleral icterus. EXTREMITIES: Mild left lower extremity edema.  No skin discoloration or tenderness.  No palpable cords. NEUROLOGICAL: Unremarkable. PSYCH:  Appropriate.   No visits with results within 3 Day(s) from this visit.  Latest known visit with results is:  Appointment on 11/21/2016  Component Date Value Ref  Range Status  . Sodium 11/21/2016 137  135 - 145 mmol/L Final  . Potassium 11/21/2016 3.7  3.5 - 5.1 mmol/L Final  . Chloride 11/21/2016 104  101 - 111 mmol/L Final  . CO2 11/21/2016 26  22 - 32 mmol/L Final  . Glucose, Bld 11/21/2016 96  65 - 99 mg/dL Final  . BUN 11/21/2016 10  6 - 20 mg/dL Final  . Creatinine, Ser 11/21/2016 0.72  0.44 - 1.00 mg/dL Final  . Calcium 11/21/2016 9.3  8.9 -  10.3 mg/dL Final  . Total Protein 11/21/2016 8.2* 6.5 - 8.1 g/dL Final  . Albumin 11/21/2016 4.5  3.5 - 5.0 g/dL Final  . AST 11/21/2016 27  15 - 41 U/L Final  . ALT 11/21/2016 24  14 - 54 U/L Final  . Alkaline Phosphatase 11/21/2016 60  38 - 126 U/L Final  . Total Bilirubin 11/21/2016 0.6  0.3 - 1.2 mg/dL Final  . GFR calc non Af Amer 11/21/2016 >60  >60 mL/min Final  . GFR calc Af Amer 11/21/2016 >60  >60 mL/min Final   Comment: (NOTE) The eGFR has been calculated using the CKD EPI equation. This calculation has not been validated in all clinical situations. eGFR's persistently <60 mL/min signify possible Chronic Kidney Disease.   . Anion gap 11/21/2016 7  5 - 15 Final  . WBC 11/21/2016 6.1  3.6 - 11.0 K/uL Final  . RBC 11/21/2016 5.07  3.80 - 5.20 MIL/uL Final  . Hemoglobin 11/21/2016 14.3  12.0 - 16.0 g/dL Final  . HCT 11/21/2016 43.3  35.0 - 47.0 % Final  . MCV 11/21/2016 85.5  80.0 - 100.0 fL Final  . MCH 11/21/2016 28.3  26.0 - 34.0 pg Final  . MCHC 11/21/2016 33.1  32.0 - 36.0 g/dL Final  . RDW 11/21/2016 14.2  11.5 - 14.5 % Final  . Platelets 11/21/2016 300  150 - 440 K/uL Final  . Neutrophils Relative % 11/21/2016 52  % Final  . Neutro Abs 11/21/2016 3.2  1.4 - 6.5 K/uL Final  . Lymphocytes Relative 11/21/2016 31  % Final  . Lymphs Abs 11/21/2016 1.9  1.0 - 3.6 K/uL Final  . Monocytes Relative 11/21/2016 8  % Final  . Monocytes Absolute 11/21/2016 0.5  0.2 - 0.9 K/uL Final  . Eosinophils Relative 11/21/2016 8  % Final  . Eosinophils Absolute 11/21/2016 0.5  0 - 0.7 K/uL Final  . Basophils Relative 11/21/2016 1  % Final  . Basophils Absolute 11/21/2016 0.1  0 - 0.1 K/uL Final  . PTT Lupus Anticoagulant 11/21/2016 41.4  0.0 - 51.9 sec Final  . DRVVT 11/21/2016 43.4  0.0 - 47.0 sec Final  . Lupus Anticoag Interp 11/21/2016 Comment:   Corrected   Comment: (NOTE) No lupus anticoagulant was detected. Performed At: [facility] [place], [place]  [id number] Lindon Romp MD [id number]   . Anticardiolipin IgG 11/21/2016 <9  0 - 14 GPL U/mL Final   Comment: (NOTE)                          Negative:              <15                          Indeterminate:     15 - 20  Low-Med Positive: >20 - 80                          High Positive:         >80   . Anticardiolipin IgM 11/21/2016 <9  0 - 12 MPL U/mL Final   Comment: (NOTE)                          Negative:              <13                          Indeterminate:     13 - 20                          Low-Med Positive: >20 - 80                          High Positive:         >80   . Anticardiolipin IgA 11/21/2016 <9  0 - 11 APL U/mL Final   Comment: (NOTE)                          Negative:              <12                          Indeterminate:     12 - 20                          Low-Med Positive: >20 - 80                          High Positive:         >80 Performed At: [facility] [place], [place] [id number] Lindon Romp MD [id number]   . Beta-2 Glyco I IgG 11/21/2016 <9  0 - 20 GPI IgG units Final   Comment: (NOTE) The reference interval reflects a 3SD or 99th percentile interval, which is thought to represent a potentially clinically significant result in accordance with the International Consensus Statement on the classification criteria for definitive antiphospholipid syndrome (APS). J Thromb Haem 2006;4:295-306.   . Beta-2-Glycoprotein I IgM 11/21/2016 <9  0 - 32 GPI IgM units Final   Comment: (NOTE) The reference interval reflects a 3SD or 99th percentile interval, which is thought to represent a potentially clinically significant result in accordance with the International Consensus Statement on the classification criteria for definitive antiphospholipid syndrome (APS). J Thromb Haem 2006;4:295-306. Performed At: [facility] [place], [place] [id number] Lindon Romp MD  [id number]   . Beta-2-Glycoprotein I IgA 11/21/2016 <9  0 - 25 GPI IgA units Final   Comment: (NOTE) The reference interval reflects a 3SD or 99th percentile interval, which is thought to represent a potentially clinically significant result in accordance with the International Consensus Statement on the classification criteria for definitive antiphospholipid syndrome (APS). J Thromb Haem 2006;4:295-306.   Marland Kitchen Protein C, Total 11/21/2016 120  60 - 150 % Final   Comment: (NOTE) Performed At: [facility] [address] [place], [place] [id number] Darrel Hoover  F MD [id number]   . Protein S Ag, Total 11/21/2016 80  60 - 150 % Final   Comment: (NOTE) This test was developed and its performance characteristics determined by [facility]. It has not been cleared or approved by the Food and Drug Administration. Performed At: [facility] [place], [place] [id number] Lindon Romp MD NO:[id number]   . AntiThromb III Func 11/21/2016 104  75 - 120 % Final   Performed at [facility], [place] [address], [place], [place] [address]    Assessment:  Jasmen Emrich is a 26 y.o. female with a left lower extremity DVT while on birth control pills.  She is heterozygous for Factor V Leiden (single R506Q mutation).  Left lower extremity duplex on 08/02/2016 revealed an acute deep venous thrombosis involving the left popliteal and posterior tibial veins.  She is on Eliquis.  She denies any immobility, long car or plane rides.  She denies any dehydration.  She was on birth control pills for 2 years (discontinued). She does not smoke.  There is a family history of thrombosis (maternal grandmother, paternal grandmother).  A maternal aunt has lupus.  Labs on 08/02/2016 revealed a a normal CBC, BMP, PT.  PTT was < 24 (low).  Work-up on 08/17/2016 revealed Factor V Leiden (single R506Q mutation).  The following labs were normal:  CBC with diff, CMP, PTT (36), lupus  anticoagulant panel, beta-2 glycoprotein antibodies, and prothrombin gene mutation.  Anticardiolipin IgM was indeterminate (14).  Work-up on 11/22/2016 revealed the following normal labs:  protein C total (120%), protein S total (80%), AT III activity (104%), lupus anticoagulant panel, anticardiolipin antibodies, beta-2 glycoprotein antibodies.  She is traveling to [place] (12/22/2016 - 01/11/2017).  Symptomatically, she denies any B symptoms.  Exam reveals left lower extremity edema.  Plan: 1.  Review interval labs.  No evidence of a lupus anticoagulant.   2.  Discuss check for activity levels (protein C and S activity; AT III antigen). 3.  Discuss plan for minimum of 6 months of anticoagulation.  Discuss repeat ultrasound prior to discontinuation of Eliquis. 4.  Labs today:  Protein C and S activity, AT III antigen. 5.  Refill Eliquis. 6.  Schedule LLE duplex on 01/30/2017. 7.  RTC on 02/01/2017 for MD assessment and review of testing.   Lequita Asal, MD  12/07/2016, 4:26 PM

## 2016-12-07 NOTE — Progress Notes (Signed)
Patient here today to follow up on labs and for assessment.  Patient states she has had some varicose veins that have popped up and seem to be close to the skin.

## 2016-12-09 LAB — PROTEIN S ACTIVITY: Protein S Activity: 80 % (ref 63–140)

## 2016-12-09 LAB — PROTEIN C ACTIVITY: Protein C Activity: 103 % (ref 73–180)

## 2016-12-09 LAB — ANTITHROMBIN PANEL
AT III AG PPP IMM-ACNC: 123 % (ref 72–124)
Antithrombin Activity: 130 % (ref 75–135)

## 2016-12-20 ENCOUNTER — Encounter: Payer: Self-pay | Admitting: Hematology and Oncology

## 2016-12-21 ENCOUNTER — Other Ambulatory Visit: Payer: Self-pay | Admitting: Hematology and Oncology

## 2017-01-16 ENCOUNTER — Ambulatory Visit: Payer: 59

## 2017-01-17 ENCOUNTER — Ambulatory Visit
Admission: RE | Admit: 2017-01-17 | Discharge: 2017-01-17 | Disposition: A | Discharge status: Home / Self Care | Payer: 59 | Source: Ambulatory Visit | Attending: Hematology and Oncology | Admitting: Hematology and Oncology

## 2017-01-17 DIAGNOSIS — I82442 Acute embolism and thrombosis of left tibial vein: Secondary | ICD-10-CM | POA: Diagnosis not present

## 2017-01-17 DIAGNOSIS — I82432 Acute embolism and thrombosis of left popliteal vein: Secondary | ICD-10-CM | POA: Diagnosis not present

## 2017-01-24 ENCOUNTER — Encounter: Payer: Self-pay | Admitting: Hematology and Oncology

## 2017-01-31 ENCOUNTER — Ambulatory Visit: Payer: 59

## 2017-02-01 ENCOUNTER — Ambulatory Visit: Payer: 59 | Admitting: Hematology and Oncology

## 2017-02-07 NOTE — Progress Notes (Signed)
[facility]-  Cancer Center  Clinic day:  02/08/2017  Chief Complaint: Hannah Patterson is a 26 y.o. female with a left lower extremity DVT who is seen for 2 month assessment and review of recent imaging.   HPI:  The patient was last seen in the hematology clinic on 12/07/2016.  At that time, she denied complaint.  Exam revealed some varicose veins.  She was taking Eliquis 5 mg twice a day.  She discussed plans to travel to [place] in 12/2016.  Additional hypercoagulable workup performed on 12/07/2016 revealed the following:  protein S activity normal at 80% (63-140%). Protein C activity normal at 103% (73-180%). AT III antigen normal at 123% (72-124%).   Repeat left lower extremity duplex on 01/17/2017 revealed a persistent occlusive thrombus in the left posterior tibial vein, unchanged since 07/2016. There was interval resolution of the left popliteal vein thrombus previously noted in 07/2016.   Symptomatically, she is doing well. She had increased swelling and pain in her left leg during her flight to [place]. Her leg was "fine and went down" while in [place], and she had a "little swelling" during the trip back. Patient is no longer taking her Eliquis.  She stopped taking Eliquis on 02/02/2017.  She denies chest pain and shortness of breath.    Past Medical History:  Diagnosis Date  . DVT (deep venous thrombosis) (HCC)   . Heart murmur     No past surgical history on file.  Family History  Problem Relation Age of Onset  . Cancer Maternal Aunt     Social History:  reports that she has never smoked. She has never used smokeless tobacco. She reports that she does not drink alcohol. Her drug history is not on file.  She is a Clinical biochemist for grades K-5.  She lives in [place].  She traveled to [place] in 12/2016.   She is alone today.  Allergies: No Known Allergies  Current Medications: No current outpatient prescriptions on file.   No current  facility-administered medications for this visit.     Review of Systems:  GENERAL:  Feels good.  No fevers or sweats.  Weight stable.  PERFORMANCE STATUS (ECOG):  0 HEENT:  No visual changes, runny nose, sore throat, mouth sores or tenderness. Lungs: No shortness of breath or cough.  No hemoptysis. Cardiac:  No chest pain, palpitations, orthopnea, or PND. GI:  No nausea, vomiting, diarrhea, constipation, melena or hematochezia.  GU:  No urgency, frequency, dysuria, or hematuria. Musculoskeletal:  No back pain.  No joint pain.  No muscle tenderness. Extremities:  Left lower extremity swelling.  Varicose veins.  No pain. Skin:  No rashes or skin changes. Neuro:  No headache, numbness or weakness, balance or coordination issues. Endocrine:  No diabetes, thyroid issues, hot flashes or night sweats. Psych:  No mood changes, depression or anxiety. Pain:  No focal pain. Review of systems:  All other systems reviewed and found to be negative.  Physical Exam: Blood pressure 111/77, pulse 73, temperature 97.8 F (36.6 C), temperature source Tympanic, resp. rate 18, weight 201 lb 4.5 oz (91.3 kg). GENERAL:  Well developed, well nourished, heavyset woman sitting comfortably in the exam room in no acute distress. MENTAL STATUS:  Alert and oriented to person, place and time. HEAD:  Long dark brown hair.  Normocephalic, atraumatic, face symmetric, no Cushingoid features. EYES:  Brown eyes.  Pupils equal round and reactive to light and accomodation.  No conjunctivitis or scleral icterus.  ENT:  Oropharynx clear without lesion.  Tongue normal. Mucous membranes moist.  RESPIRATORY:  Clear to auscultation without rales, wheezes or rhonchi. CARDIOVASCULAR:  Regular rate and rhythm without murmur, rub or gallop. ABDOMEN:  Soft, non-tender, with active bowel sounds, and no hepatosplenomegaly.  No masses. SKIN:  No rashes, ulcers or lesions. EXTREMITIES:  Minimal left lower extremity edema.  No skin  discoloration or tenderness.  No palpable cords. LYMPH NODES: No palpable cervical, supraclavicular, axillary or inguinal adenopathy  NEUROLOGICAL: Unremarkable. PSYCH:  Appropriate.   No visits with results within 3 Day(s) from this visit.  Latest known visit with results is:  Appointment on 12/07/2016  Component Date Value Ref Range Status  . Protein S Activity 12/07/2016 80  63 - 140 % Final   Comment: (NOTE) Protein S activity may be falsely increased (masking an abnormal, low result) in patients receiving direct Xa inhibitor (e.g., rivaroxaban, apixaban, edoxaban) or a direct thrombin inhibitor (e.g., dabigatran) anticoagulant treatment due to assay interference by these drugs. Performed At: [facility] [place] [address] [place], [place] [id number] Mila HomerHancock William F MD WU:[id number]:[phone]   . Protein C Activity 12/07/2016 103  73 - 180 % Final   Comment: (NOTE) Performed At: [facility] [place] [address] [place], [place] [id number] Mila HomerHancock William F MD VH:[id number]:[phone]   . Antithrombin Activity 12/07/2016 130  75 - 135 % Final   Comment: (NOTE) Direct Xa inhibitor anticoagulants such as rivaroxaban, apixaban and edoxaban will lead to spuriously elevated antithrombin activity levels possibly masking a deficiency.   . AT III AG PPP IMM-ACNC 12/07/2016 123  72 - 124 % Final   Comment: (NOTE) This test was developed and its performance characteristics determined by [facility]. It has not been cleared or approved by the Food and Drug Administration. Performed At: [facility] [place] [address] [place], [place] [id number] Mila HomerHancock William F MD [id number]:[phone]     Assessment:  Hannah Patterson is a 26 y.o. female with a left lower extremity DVT while on birth control pills.  She is heterozygous for Factor V Leiden (single R506Q mutation).  Left lower extremity duplex on 08/02/2016 revealed an acute deep venous thrombosis involving the left popliteal and posterior tibial  veins.  She was treated with Eliquis (completed 02/02/2017).  She denies any immobility, long car or plane rides.  She denies any dehydration.  She was on birth control pills for 2 years (discontinued). She does not smoke.  There is a family history of thrombosis (maternal grandmother, paternal grandmother).  A maternal aunt has lupus.  Labs on 08/02/2016 revealed a a normal CBC, BMP, PT.  PTT was < 24 (low).  Work-up on 08/17/2016 revealed Factor V Leiden (single R506Q mutation).  The following labs were normal:  CBC with diff, CMP, PTT (36), lupus anticoagulant panel, beta-2 glycoprotein antibodies, and prothrombin gene mutation.  Anticardiolipin IgM was indeterminate (14).  Work-up on 11/22/2016 and 12/07/2016 revealed the following normal labs:  protein C total (120%) and activity (103%), protein S total (80%) and activity (80%), AT III antigen (123%) and activity (104%), lupus anticoagulant panel, anticardiolipin antibodies, beta-2 glycoprotein antibodies.   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 There was interval resolution of the left popliteal vein thrombus that was previously seen back in March 2018.  Symptomatically, she is doing well. She denies chest pain and shortness of breath.  Exam stable.   Plan: 1.  Review interval labs:  protein S, protein C, and antithrombin panels all normal . 2.  Review LLE ultrasound results. DVT still present in the LEFT posterior tibial vein, with interval resolution of the left popliteal vein thrombus. 3.  Labs today: D-dimer. 4.  Discuss pregnancy. Patient practicing safe sex practices, however verbalizes that she is considering becoming pregnant.  Patient to follow up with high risk OB/GYN Dalbert Garnet) on 02/16/2017 due to her history of Factor V leiden and DVT. 5.  RTC with pregnancy and prn.   Quentin Mulling, NP  02/08/2017, 2:48 PM   I saw and  evaluated the patient, participating in the key portions of the service and reviewing pertinent diagnostic studies and records.  I reviewed the nurse practitioner's note and agree with the findings and the plan.  The assessment and plan were discussed with the patient.   Multiple questions were asked by the patient and answered.   Rosey Bath, MD 02/08/2017

## 2017-02-08 ENCOUNTER — Telehealth: Payer: Self-pay | Admitting: Urgent Care

## 2017-02-08 ENCOUNTER — Inpatient Hospital Stay: Payer: 59

## 2017-02-08 ENCOUNTER — Inpatient Hospital Stay: Payer: 59 | Attending: Hematology and Oncology | Admitting: Hematology and Oncology

## 2017-02-08 VITALS — BP 111/77 | HR 73 | Temp 97.8°F | Resp 18 | Wt 201.3 lb

## 2017-02-08 DIAGNOSIS — Z8249 Family history of ischemic heart disease and other diseases of the circulatory system: Secondary | ICD-10-CM | POA: Diagnosis not present

## 2017-02-08 DIAGNOSIS — I82432 Acute embolism and thrombosis of left popliteal vein: Secondary | ICD-10-CM

## 2017-02-08 DIAGNOSIS — Z7901 Long term (current) use of anticoagulants: Secondary | ICD-10-CM

## 2017-02-08 DIAGNOSIS — R011 Cardiac murmur, unspecified: Secondary | ICD-10-CM

## 2017-02-08 DIAGNOSIS — Z809 Family history of malignant neoplasm, unspecified: Secondary | ICD-10-CM

## 2017-02-08 DIAGNOSIS — I82442 Acute embolism and thrombosis of left tibial vein: Secondary | ICD-10-CM

## 2017-02-08 DIAGNOSIS — D6851 Activated protein C resistance: Secondary | ICD-10-CM | POA: Diagnosis not present

## 2017-02-08 DIAGNOSIS — Z86718 Personal history of other venous thrombosis and embolism: Secondary | ICD-10-CM | POA: Diagnosis not present

## 2017-02-08 LAB — FIBRIN DERIVATIVES D-DIMER (ARMC ONLY): Fibrin derivatives D-dimer (ARMC): 315.1 (ref 0.00–499.00)

## 2017-02-08 NOTE — Progress Notes (Signed)
Patient offers no complaints today.  States her period was a little different this month.  Seemed to be shorter.

## 2017-02-08 NOTE — Telephone Encounter (Signed)
D-dimer results called to patient. Result normal. Patient to follow up with GYN next week. Asked again to have them touch base with Dr. Merlene Pullingorcoran after her visit. Patient verbalized understanding. Strict return precautions reviewed with patient. Patient aware that chest pain, shortness of breath, palpitations, or leg swelling warrant immediate evaluation.

## 2017-02-13 ENCOUNTER — Encounter: Payer: Self-pay | Admitting: Hematology and Oncology

## 2017-02-16 DIAGNOSIS — D6851 Activated protein C resistance: Secondary | ICD-10-CM | POA: Diagnosis not present

## 2017-02-16 DIAGNOSIS — Z86718 Personal history of other venous thrombosis and embolism: Secondary | ICD-10-CM | POA: Diagnosis not present

## 2017-02-16 DIAGNOSIS — Z3169 Encounter for other general counseling and advice on procreation: Secondary | ICD-10-CM | POA: Diagnosis not present

## 2017-03-02 ENCOUNTER — Ambulatory Visit: Payer: 59

## 2017-03-06 ENCOUNTER — Other Ambulatory Visit: Payer: Self-pay | Admitting: *Deleted

## 2017-03-06 ENCOUNTER — Ambulatory Visit
Admission: RE | Admit: 2017-03-06 | Discharge: 2017-03-06 | Disposition: A | Discharge status: Home / Self Care | Payer: 59 | Source: Ambulatory Visit | Attending: Maternal & Fetal Medicine | Admitting: Maternal & Fetal Medicine

## 2017-03-06 VITALS — BP 127/78 | HR 90 | Temp 98.2°F | Resp 18 | Ht 60.0 in | Wt 198.0 lb

## 2017-03-06 DIAGNOSIS — Z3169 Encounter for other general counseling and advice on procreation: Secondary | ICD-10-CM | POA: Diagnosis not present

## 2017-03-06 DIAGNOSIS — D6851 Activated protein C resistance: Secondary | ICD-10-CM | POA: Diagnosis not present

## 2017-03-06 DIAGNOSIS — I82409 Acute embolism and thrombosis of unspecified deep veins of unspecified lower extremity: Secondary | ICD-10-CM | POA: Insufficient documentation

## 2017-03-06 DIAGNOSIS — D6852 Prothrombin gene mutation: Secondary | ICD-10-CM | POA: Diagnosis not present

## 2017-03-06 DIAGNOSIS — Z86718 Personal history of other venous thrombosis and embolism: Secondary | ICD-10-CM | POA: Insufficient documentation

## 2017-03-06 DIAGNOSIS — I82432 Acute embolism and thrombosis of left popliteal vein: Secondary | ICD-10-CM | POA: Diagnosis not present

## 2017-03-06 DIAGNOSIS — R011 Cardiac murmur, unspecified: Secondary | ICD-10-CM | POA: Insufficient documentation

## 2017-03-06 HISTORY — DX: Activated protein C resistance: D68.51

## 2017-03-06 LAB — PROTEIN / CREATININE RATIO, URINE
Creatinine, Urine: 34 mg/dL
Total Protein, Urine: <6 mg/dL

## 2017-03-06 NOTE — Progress Notes (Signed)
[facility] Consultation   Chief Complaint: Here for preconception consultation.  History DVT, FVL heterozygosity.  HPI: [name] is a 26 y.o. G0 LMP 03/03/2017 who is referred by Dr. Dalbert Garnet for preconception consultation due to the patient's history of DVT and FVL heterozygosity.    The patient was diagnosed with LLE extremity DVT on 08/02/2016.  She had been experiencing symptoms ~ 1 week.  She was on a triphasic OCP at the time.  The L posterior tibial vein and L popliteal veins were thrombosed.  The patient was started on apixaban and was anticoagulated for 6 months.  01/17/2017 the patient had follow-up ultrasound which revealed persistent occlusive thrombus in the L posterior tibial vein, unchanged since 07/2016, but resolution of the L popliteal vein thrombosis. She stopped taking apixaban 02/02/2017 with the blessing of Dr. Merlene Pulling, her hematologist.  She continues to have some swelling in her LLE compared to her right and this was aggravated during a recent trip to [place].  Gynecologic History:  LMP 03/03/2017 Last Pap was 07/31/2014 Normal periods lasting 4-5 days every 28 days now.  (Were heavy on apixaban).   Past Medical History: Patient  has a past medical history of DVT (deep venous thrombosis) (HCC); Factor V Leiden (HCC); and Heart murmur.   Past Surgical History: She  has no past surgical history on file.   Medications: None, but she had been advised to start PNV.  Allergies: Patient has No Known Allergies.   Social History: Patient  reports that she has never smoked. She has never used smokeless tobacco. She reports that she does not drink alcohol or use drugs. She is a Designer, multimedia.  Her husband is a Careers information officer.  Family History: family history includes Alcohol abuse in her father; Cancer in her maternal aunt; Hypertension in her mother; Miscarriages / Stillbirths in her paternal grandmother; Pulmonary embolism in her maternal  grandmother.   Review of Systems A full 12 point review of systems was negative or as noted in the History of Present Illness.  Physical Exam: BP 127/78   Pulse 90   Temp 98.2 F (36.8 C)   Resp 18   Ht 5' (1.524 m)   Wt 198 lb (89.8 kg)   SpO2 100%   BMI 38.67 kg/m   L calf measures 43 cm R calf measures 42 cm  From Dr. Danton Sewer note of 02/08/2017: Work-up on 08/17/2016 revealed Factor V Leiden (single R506Q mutation).  The following labs were normal:  CBC with diff, CMP, PTT (36), lupus anticoagulant panel, beta-2 glycoprotein antibodies, and prothrombin gene mutation.  Anticardiolipin IgM was indeterminate (14).  Work-up on 11/22/2016 and 12/07/2016 revealed the following normal labs:  protein C total (120%) and activity (103%), protein S total (80%) and activity (80%), AT III antigen (123%) and activity (104%), lupus anticoagulant panel, anticardiolipin antibodies, beta-2 glycoprotein antibodies.   07/10/2015 - TSH 2.321 August 11, 2016 - ANA normal  11/21/2016 - CMP normal  Previous Labs:  Asessement: 26 yo gravida 0 with history of DVT in the setting of OCPs, FVL heterozygosity,  here for preconception consultation.  Her other risk factor for recurrent DVT and adverse pregnancy outcome is obesity (BMI).  A BMI of 30-40 increases the risk of VTE 2 to 4-fold.    Recommendations: 1. The patient was counseled that, yes, a future pregnancy would be high risk, but not so high that we would discourage her from pursuing a pregnancy or from delivering with Dr. Dalbert Garnet  at [facility].  2. She was counseled that her risk of recurrent VTE in a future pregnancy is 1-2% with anticoagulation.   3. She was counseled about the safety of LMWH and the risks with non-heparin anticoagulants during pregnancy.   4. I would suggest a dose of 60 mg of enoxaparin qd starting with conception, holding 24 hours prior to planned delivery and resuming 12 hours after a vaginal delivery or 24 hours after a cesarean.  She  should have pneumatic compression devices in place while off of anticoagulation.   5. She should continue anticoagulation for at least 6 weeks PP.   6. She was counseled that warfarin is also safe during breastfeeding, but that most women prefer to remain on LMWH, rather than converting to warfarin for a short time.  Should she and Dr. Merlene Pulling decide to continue for 12 rather than 6 weeks PP, she may want to convert to warfarin.  Bridging, however, should not be attempted before 2 weeks PP.   7. Hypercoagulability, FVL and obesity increase the risk for adverse pregnancy outcome. Besides routine fetal surveillance, I would recommend, monthly USs for growth in the third trimester and weekly testing starting at 34-[redacted] weeks gestation.   8. She was encouraged to start her PNV now. 9. She was encouraged to get the flu vaccine.   10. The patient received preliminary instruction in enoxaparin self-administration today.  She will need a refresher when she becomes pregnant and will need a prescription sent to her pharmacy. 11. A baseline P/C ratio was obtained today.    I will be happy to see the patient again in a future pregnancy to review delivery planning.     Total time spent with the patient was 40 minutes with greater than 50% spent in counseling and coordination of care.        Argentina Ponder, MD [facility]

## 2017-03-06 NOTE — Progress Notes (Signed)
Printed lovenox injection instructions and reviewed with patient.  Patient verbalizes understanding.

## 2017-04-06 DIAGNOSIS — R109 Unspecified abdominal pain: Secondary | ICD-10-CM | POA: Diagnosis not present

## 2017-04-06 DIAGNOSIS — O26899 Other specified pregnancy related conditions, unspecified trimester: Secondary | ICD-10-CM | POA: Diagnosis not present

## 2017-04-07 DIAGNOSIS — Z86718 Personal history of other venous thrombosis and embolism: Secondary | ICD-10-CM | POA: Diagnosis not present

## 2017-04-07 DIAGNOSIS — O3680X0 Pregnancy with inconclusive fetal viability, not applicable or unspecified: Secondary | ICD-10-CM | POA: Diagnosis not present

## 2017-04-07 DIAGNOSIS — D6851 Activated protein C resistance: Secondary | ICD-10-CM | POA: Diagnosis not present

## 2017-04-07 DIAGNOSIS — Z3201 Encounter for pregnancy test, result positive: Secondary | ICD-10-CM | POA: Diagnosis not present

## 2017-04-17 DIAGNOSIS — O3680X0 Pregnancy with inconclusive fetal viability, not applicable or unspecified: Secondary | ICD-10-CM | POA: Diagnosis not present

## 2017-05-17 DIAGNOSIS — Z3401 Encounter for supervision of normal first pregnancy, first trimester: Secondary | ICD-10-CM | POA: Diagnosis not present

## 2017-05-17 LAB — OB RESULTS CONSOLE GC/CHLAMYDIA
CHLAMYDIA, DNA PROBE: NEGATIVE
GC PROBE AMP, GENITAL: NEGATIVE

## 2017-05-17 LAB — OB RESULTS CONSOLE VARICELLA ZOSTER ANTIBODY, IGG: Varicella: IMMUNE

## 2017-05-17 LAB — OB RESULTS CONSOLE HEPATITIS B SURFACE ANTIGEN: Hepatitis B Surface Ag: NEGATIVE

## 2017-05-17 LAB — OB RESULTS CONSOLE RUBELLA ANTIBODY, IGM: Rubella: NON-IMMUNE/NOT IMMUNE

## 2017-05-22 ENCOUNTER — Encounter: Payer: Self-pay | Admitting: Hematology and Oncology

## 2017-05-30 NOTE — L&D Delivery Note (Signed)
Date of delivery: 12/01/17 Estimated Date of Delivery: 12/08/17 Patient's last menstrual period was 03/03/2017. EGA: [id number]  Delivery Note At 10:19 PM a viable female was delivered via Vaginal, Spontaneous (Presentation: cephalic; ROA).  APGAR: 8, 9; weight 5 lb 9.2 oz (2530 g).   Placenta status: spontaneous, intact.  Cord: short, 3vv.  with the following complications: none.  Cord pH: not collected  Anesthesia:  epidural Episiotomy: None Lacerations: Vaginal abrasion (posterior) Suture Repair: 3.0 vicryl rapide - figure of eight Est. Blood Loss (mL): 235cc measured  Mom presented to L&D with IOL for GDMA and Factor V Leiden mutation.  Lovenox had been last administered 24hrs before induction.  She was given cytotec x2 doses, then AROM, then augmented with pitocin.  epidual placed. Due to persistent variables/Category 2 tracing, pitocin was discontinued.  She  progressed to complete, and labored down. Active second stage: 40 mins, with delivery of fetal head with restitution to ROT.   Anterior then posterior shoulders delivered without difficulty. Due to short cord, baby was held at the perineum, and attended to by peds during delayed cord clamping.   Cord was then clamped and cut by FOB and placed on mom's chest.  Placenta spontaneously delivered, intact.   IV pitocin given for hemorrhage prophylaxis. 3-0 vicryl placed in a figure-of-eight for hemostasis in the posterior vaginal mucosa.  We sang happy birthday to baby [name].  Mom to postpartum.  Baby to Couplet care / Skin to Skin.  ----- [name], MD Attending Obstetrician and Gynecologist [facility], Department of OB/GYN [facility]

## 2017-05-31 DIAGNOSIS — Z315 Encounter for genetic counseling: Secondary | ICD-10-CM | POA: Insufficient documentation

## 2017-07-10 ENCOUNTER — Encounter: Payer: Self-pay | Admitting: Emergency Medicine

## 2017-07-10 ENCOUNTER — Other Ambulatory Visit: Payer: Self-pay

## 2017-07-10 ENCOUNTER — Emergency Department: Payer: BC Managed Care – PPO

## 2017-07-10 ENCOUNTER — Emergency Department
Admission: EM | Admit: 2017-07-10 | Discharge: 2017-07-10 | Disposition: A | Discharge status: Home / Self Care | Payer: BC Managed Care – PPO | Attending: Emergency Medicine | Admitting: Emergency Medicine

## 2017-07-10 DIAGNOSIS — O9989 Other specified diseases and conditions complicating pregnancy, childbirth and the puerperium: Secondary | ICD-10-CM | POA: Diagnosis present

## 2017-07-10 DIAGNOSIS — O26852 Spotting complicating pregnancy, second trimester: Secondary | ICD-10-CM | POA: Insufficient documentation

## 2017-07-10 DIAGNOSIS — O200 Threatened abortion: Secondary | ICD-10-CM | POA: Diagnosis not present

## 2017-07-10 DIAGNOSIS — Z3A18 18 weeks gestation of pregnancy: Secondary | ICD-10-CM | POA: Diagnosis not present

## 2017-07-10 LAB — CBC WITH DIFFERENTIAL/PLATELET
Basophils Absolute: 0 10*3/uL (ref 0–0.1)
Basophils Relative: 0 %
EOS ABS: 0.3 10*3/uL (ref 0–0.7)
Eosinophils Relative: 2 %
HCT: 36.7 % (ref 35.0–47.0)
Hemoglobin: 12.6 g/dL (ref 12.0–16.0)
LYMPHS ABS: 2.4 10*3/uL (ref 1.0–3.6)
Lymphocytes Relative: 21 %
MCH: 30 pg (ref 26.0–34.0)
MCHC: 34.3 g/dL (ref 32.0–36.0)
MCV: 87.5 fL (ref 80.0–100.0)
Monocytes Absolute: 0.8 10*3/uL (ref 0.2–0.9)
Monocytes Relative: 7 %
Neutro Abs: 7.8 10*3/uL — ABNORMAL HIGH (ref 1.4–6.5)
Neutrophils Relative %: 70 %
Platelets: 256 10*3/uL (ref 150–440)
RBC: 4.2 MIL/uL (ref 3.80–5.20)
RDW: 14.1 % (ref 11.5–14.5)
WBC: 11.3 10*3/uL — AB (ref 3.6–11.0)

## 2017-07-10 LAB — URINALYSIS, COMPLETE (UACMP) WITH MICROSCOPIC
Bacteria, UA: NONE SEEN
Bilirubin Urine: NEGATIVE
GLUCOSE, UA: NEGATIVE mg/dL
Hgb urine dipstick: NEGATIVE
Ketones, ur: 5 mg/dL — AB
Leukocytes, UA: NEGATIVE
Nitrite: NEGATIVE
PROTEIN: NEGATIVE mg/dL
Specific Gravity, Urine: 1.002 — ABNORMAL LOW (ref 1.005–1.030)
pH: 5 (ref 5.0–8.0)

## 2017-07-10 LAB — COMPREHENSIVE METABOLIC PANEL
ALT: 16 U/L (ref 14–54)
AST: 18 U/L (ref 15–41)
Albumin: 3.6 g/dL (ref 3.5–5.0)
Alkaline Phosphatase: 52 U/L (ref 38–126)
Anion gap: 11 (ref 5–15)
BUN: 6 mg/dL (ref 6–20)
CHLORIDE: 102 mmol/L (ref 101–111)
CO2: 20 mmol/L — ABNORMAL LOW (ref 22–32)
CREATININE: 0.5 mg/dL (ref 0.44–1.00)
Calcium: 8.9 mg/dL (ref 8.9–10.3)
GFR calc Af Amer: >60 mL/min (ref >60)
Glucose, Bld: 84 mg/dL (ref 65–99)
Potassium: 3.2 mmol/L — ABNORMAL LOW (ref 3.5–5.1)
Sodium: 133 mmol/L — ABNORMAL LOW (ref 135–145)
Total Bilirubin: 0.4 mg/dL (ref 0.3–1.2)
Total Protein: 7.5 g/dL (ref 6.5–8.1)

## 2017-07-10 LAB — HCG, QUANTITATIVE, PREGNANCY: hCG, Beta Chain, Quant, S: 40165 m[IU]/mL — ABNORMAL HIGH (ref <5)

## 2017-07-10 NOTE — ED Triage Notes (Signed)
Pt to ED via POV, pt is G1P0, pt is [redacted] weeks pregnant. Pt states that this morning she started spotting. Pt denies passing any clots, Pt states that she has not had abdominal pain. Last intercourse yesterday. Pt in NAD at this time.

## 2017-07-10 NOTE — ED Provider Notes (Signed)
[facility] Emergency Department Provider Note       Time seen: ----------------------------------------- 7:12 PM on 07/10/2017 -----------------------------------------   I have reviewed the triage vital signs and the nursing notes.  HISTORY   Chief Complaint Vaginal Bleeding    HPI Hannah Patterson is a 27 y.o. female with a history of DVT, factor V Leiden deficiency, heart murmur who is currently around [redacted] weeks pregnant who presents to the ED for vaginal spotting.  Patient states spotting has resolved at this time.  She is G1 P0, had spotting several weeks ago which resolved spontaneously.  Last intercourse was yesterday.  She is not having any abdominal pain or leakage of fluid.  Past Medical History:  Diagnosis Date  . DVT (deep venous thrombosis) (HCC)   . Factor V Leiden (HCC)    heterozygous  . Heart murmur     Patient Active Problem List   Diagnosis Date Noted  . DVT (deep venous thrombosis) (HCC)   . Factor V Leiden (HCC) 10/07/2016  . Acute deep vein thrombosis (DVT) of popliteal vein of left lower extremity (HCC) 08/17/2016    History reviewed. No pertinent surgical history.  Allergies Patient has no known allergies.  Social History Social History   Tobacco Use  . Smoking status: Never Smoker  . Smokeless tobacco: Never Used  Substance Use Topics  . Alcohol use: No  . Drug use: No    Review of Systems Constitutional: Negative for fever. Cardiovascular: Negative for chest pain. Respiratory: Negative for shortness of breath. Gastrointestinal: Negative for abdominal pain, vomiting and diarrhea. Genitourinary: Positive for vaginal spotting Musculoskeletal: Negative for back pain. Skin: Negative for rash. Neurological: Negative for headaches, focal weakness or numbness.  All systems negative/normal/unremarkable except as stated in the HPI  ____________________________________________   PHYSICAL EXAM:  VITAL  SIGNS: ED Triage Vitals [07/10/17 1738]  Enc Vitals Group     BP 121/75     Pulse Rate 93     Resp 20     Temp 99.1 F (37.3 C)     Temp Source Oral     SpO2 99 %     Weight 194 lb (88 kg)     Height 5' (1.524 m)     Head Circumference      Peak Flow      Pain Score      Pain Loc      Pain Edu?      Excl. in GC?    Constitutional: Alert and oriented. Well appearing and in no distress. Eyes: Conjunctivae are normal. Normal extraocular movements. ENT   Head: Normocephalic and atraumatic.   Nose: No congestion/rhinnorhea.   Mouth/Throat: Mucous membranes are moist.   Neck: No stridor. Cardiovascular: Normal rate, regular rhythm. No murmurs, rubs, or gallops. Respiratory: Normal respiratory effort without tachypnea nor retractions. Breath sounds are clear and equal bilaterally. No wheezes/rales/rhonchi. Gastrointestinal: Soft and nontender. Normal bowel sounds Musculoskeletal: Nontender with normal range of motion in extremities. No lower extremity tenderness nor edema. Neurologic:  Normal speech and language. No gross focal neurologic deficits are appreciated.  Skin:  Skin is warm, dry and intact. No rash noted. Psychiatric: Mood and affect are normal. Speech and behavior are normal.  ____________________________________________  ED COURSE:  As part of my medical decision making, I reviewed the following data within the electronic MEDICAL RECORD NUMBER History obtained from family if available, nursing notes, old chart and ekg, as well as notes from prior ED visits. Patient presented  for vaginal bleeding and pregnancy, we will assess with labs and imaging as indicated at this time.   Procedures ____________________________________________   LABS (pertinent positives/negatives)  Labs Reviewed  HCG, QUANTITATIVE, PREGNANCY - Abnormal; Notable for the following components:      Result Value   hCG, Beta Chain, Quant, S 40,165 (*)    All other components within normal  limits  CBC WITH DIFFERENTIAL/PLATELET - Abnormal; Notable for the following components:   WBC 11.3 (*)    Neutro Abs 7.8 (*)    All other components within normal limits  URINALYSIS, COMPLETE (UACMP) WITH MICROSCOPIC - Abnormal; Notable for the following components:   Color, Urine STRAW (*)    APPearance CLEAR (*)    Specific Gravity, Urine 1.002 (*)    Ketones, ur 5 (*)    Squamous Epithelial / LPF 0-5 (*)    All other components within normal limits  COMPREHENSIVE METABOLIC PANEL - Abnormal; Notable for the following components:   Sodium 133 (*)    Potassium 3.2 (*)    CO2 20 (*)    All other components within normal limits    RADIOLOGY  Ultrasound OB limited IMPRESSION: 1. Single living intrauterine gestation in cephalic lie at 18 weeks 3 days by limited fetal biometry, concordant with provided menstrual dating. 2. No acute gestational abnormality demonstrated. Normal fetal cardiac activity. Normal amniotic fluid volume. Cervix appears normal in length with no evidence of cervical funneling. No placenta previa.  This exam is performed on an emergent basis and does not comprehensively evaluate fetal size, dating, or anatomy; follow-up complete OB US should be considered if further fetal assessment is warranted. ____________________________________________  DIFFERENTIAL DIAGNOSIS   Threatened miscarriage, normal pregnancy, placenta previa, abruptio placentae  FINAL ASSESSMENT AND PLAN  Threatened miscarriage   Plan: Patient had presented for vaginal bleeding in the second trimester. Patient's labs were reassuring. Patient's imaging was reassuring.  She is not currently having any bleeding.  I have discussed with her OB/GYN doctor who is Dr. Dalbert GarnetBeasley.  She is cleared for outpatient follow-up.   Ulice DashJohnathan E Seon Gaertner, MD   Note: This note was generated in part or whole with voice recognition software. Voice recognition is usually quite accurate but there are  transcription errors that can and very often do occur. I apologize for any typographical errors that were not detected and corrected.     Emily FilbertWilliams, Sakeenah Valcarcel E, MD 07/10/17 2010

## 2017-10-05 ENCOUNTER — Encounter: Payer: BC Managed Care – PPO | Attending: Obstetrics and Gynecology | Admitting: *Deleted

## 2017-10-05 ENCOUNTER — Encounter: Payer: Self-pay | Admitting: *Deleted

## 2017-10-05 VITALS — BP 102/68 | Ht 60.0 in | Wt 201.2 lb

## 2017-10-05 DIAGNOSIS — O24419 Gestational diabetes mellitus in pregnancy, unspecified control: Secondary | ICD-10-CM | POA: Insufficient documentation

## 2017-10-05 DIAGNOSIS — Z713 Dietary counseling and surveillance: Secondary | ICD-10-CM | POA: Diagnosis present

## 2017-10-05 DIAGNOSIS — O24410 Gestational diabetes mellitus in pregnancy, diet controlled: Secondary | ICD-10-CM

## 2017-10-05 NOTE — Patient Instructions (Signed)
Read booklet on Gestational Diabetes Follow Gestational Meal Planning Guidelines Don't skip meals Include 1 protein serving with carbohydrate snack Complete a 3 Day Food Record and bring to next appointment Check blood sugars 4 x day - before breakfast and 2 hrs after every meal and record  Bring blood sugar log to all appointments Call MD for prescription for meter strips and lancets Strips  One Touch Verio  Lancets  One Touch Delica Purchase urine ketone strips if blood sugars not controlled and check urine ketones every am:  If + increase bedtime snack to 1 protein and 2 carbohydrate servings Walk 20-30 minutes at least 5 x week if permitted by MD

## 2017-10-06 NOTE — Progress Notes (Signed)
Diabetes Self-Management Education  Visit Type: First/Initial  Appt. Start Time: 1515 Appt. End Time: 1650  10/05/2017  [name], identified by name and date of birth, is a 27 y.o. female with a diagnosis of Diabetes: Gestational Diabetes.   ASSESSMENT  Blood pressure 102/68, height 5' (1.524 m), weight 201 lb 3.2 oz (91.3 kg), last menstrual period 03/03/2017. Body mass index is 39.29 kg/m.  Diabetes Self-Management Education - 10/05/17 1657      Visit Information   Visit Type  First/Initial      Initial Visit   Diabetes Type  Gestational Diabetes    Are you currently following a meal plan?  Yes    What type of meal plan do you follow?  "eating healthy"    Are you taking your medications as prescribed?  Yes    Date Diagnosed  last week      Health Coping   How would you rate your overall health?  Excellent      Psychosocial Assessment   Patient Belief/Attitude about Diabetes  Motivated to manage diabetes "irritated"    Self-care barriers  None    Self-management support  Doctor's office;Family    Patient Concerns  Nutrition/Meal planning;Glycemic Control;Monitoring    Special Needs  None    Preferred Learning Style  Auditory;Visual;Hands on    Learning Readiness  Change in progress    How often do you need to have someone help you when you read instructions, pamphlets, or other written materials from your doctor or pharmacy?  1 - Never    What is the last grade level you completed in school?  Masters      Pre-Education Assessment   Patient understands the diabetes disease and treatment process.  Needs Instruction    Patient understands incorporating nutritional management into lifestyle.  Needs Instruction    Patient undertands incorporating physical activity into lifestyle.  Needs Review    Patient understands using medications safely.  Needs Instruction    Patient understands monitoring blood glucose, interpreting and using results  Needs Review    Patient  understands prevention, detection, and treatment of acute complications.  Needs Instruction    Patient understands prevention, detection, and treatment of chronic complications.  Needs Instruction    Patient understands how to develop strategies to address psychosocial issues.  Needs Instruction    Patient understands how to develop strategies to promote health/change behavior.  Needs Instruction      Complications   How often do you check your blood sugar?  0 times/day (not testing) Pt checked her blood sugar with a family member's meter and reports 105 mg/dL 2 hrs after lunch. Provided One Touch Verio Flex meter and instructed on use. BG upon return demonstration was 73 mg/dL at 1:61 pm  - 4 hrs pp.     Have you had a dilated eye exam in the past 12 months?  No    Have you had a dental exam in the past 12 months?  Yes    Are you checking your feet?  No      Dietary Intake   Breakfast  peanut butter toast, milk and sometimes fruit    Snack (morning)  fruit    Lunch  tuna, pasta, chicken, peanut butter sandwich, non-starchy vegetables    Snack (afternoon)  nuts, peanut butter crackers    Dinner  grilled chicken, occasional beef, potatoes, rice, green beans, broccoli, cauliflower, eggplant    Beverage(s)  water, milk  Exercise   Exercise Type  Light (walking / raking leaves) walking, yoga, weights    How many days per week to you exercise?  4    How many minutes per day do you exercise?  30    Total minutes per week of exercise  120      Patient Education   Previous Diabetes Education  No    Disease state   Definition of diabetes, type 1 and 2, and the diagnosis of diabetes    Nutrition management   Role of diet in the treatment of diabetes and the relationship between the three main macronutrients and blood glucose level;Reviewed blood glucose goals for pre and post meals and how to evaluate the patients' food intake on their blood glucose level.    Physical activity and exercise    Role of exercise on diabetes management, blood pressure control and cardiac health.    Monitoring  Taught/evaluated SMBG meter.;Purpose and frequency of SMBG.;Taught/discussed recording of test results and interpretation of SMBG.;Ketone testing, when, how.    Chronic complications  Relationship between chronic complications and blood glucose control    Psychosocial adjustment  Identified and addressed patients feelings and concerns about diabetes    Preconception care  Pregnancy and GDM  Role of pre-pregnancy blood glucose control on the development of the fetus;Role of family planning for patients with diabetes;Reviewed with patient blood glucose goals with pregnancy      Individualized Goals (developed by patient)   Reducing Risk Improve blood sugars Prevent diabetes complications     Outcomes   Expected Outcomes  Demonstrated interest in learning. Expect positive outcomes    Future DMSE  2 wks       Individualized Plan for Diabetes Self-Management Training:   Learning Objective:  Patient will have a greater understanding of diabetes self-management. Patient education plan is to attend individual and/or group sessions per assessed needs and concerns.   Plan:   Patient Instructions  Read booklet on Gestational Diabetes Follow Gestational Meal Planning Guidelines Don't skip meals Include 1 protein serving with carbohydrate snack Complete a 3 Day Food Record and bring to next appointment Check blood sugars 4 x day - before breakfast and 2 hrs after every meal and record  Bring blood sugar log to all appointments Call MD for prescription for meter strips and lancets Strips  One Touch Verio  Lancets  One Touch Delica Purchase urine ketone strips if blood sugars not controlled and check urine ketones every am:  If + increase bedtime snack to 1 protein and 2 carbohydrate servings Walk 20-30 minutes at least 5 x week if permitted by MD  Expected Outcomes:  Demonstrated interest in  learning. Expect positive outcomes  Education material provided:  Gestational Booklet Gestational Meal Planning Guidelines Simple Meal Plan Viewed Gestational Diabetes Video Meter = One Touch Verio Flex 3 Day Food Record Goals for a Healthy Pregnancy  If problems or questions, patient to contact team via: Sharion Settler, RN, CCM, CDE [phone]  Future DSME appointment: 2 wks  Oct 18, 2017 with the dietitian

## 2017-10-16 ENCOUNTER — Telehealth: Payer: Self-pay | Admitting: Dietician

## 2017-10-16 NOTE — Telephone Encounter (Signed)
Called patient to change time of her appointment on 10/18/17 due to scheduling conflict. Left a message requesting a call back to confirm.

## 2017-10-18 ENCOUNTER — Encounter: Payer: Self-pay | Admitting: Dietician

## 2017-10-18 ENCOUNTER — Encounter: Payer: BC Managed Care – PPO | Admitting: Dietician

## 2017-10-18 VITALS — BP 110/70 | Ht 60.0 in | Wt 201.5 lb

## 2017-10-18 DIAGNOSIS — Z713 Dietary counseling and surveillance: Secondary | ICD-10-CM | POA: Diagnosis not present

## 2017-10-18 DIAGNOSIS — O24410 Gestational diabetes mellitus in pregnancy, diet controlled: Secondary | ICD-10-CM

## 2017-10-18 NOTE — Progress Notes (Signed)
   Patient's BG record indicates BGs are generally within normal ranges with fasting BGs ranging 74-95 + one reading of 156 after late-night eating the previous night. Post-meal BGs are ranging 74-120, with one reading of 131 after eating sweet cereal.   Patient's food diary indicates healthy food choices, and controlled carb intake. Some meals low in carbohydrate.    Provided 1700kcal meal plan, and wrote individualized menus based on patient's food preferences.  Instructed patient on food safety, including avoidance of Listeriosis, and limiting mercury from fish.  Discussed importance of maintaining healthy lifestyle habits to reduce risk of Type 2 DM as well as Gestational DM with any future pregnancies.  Advised patient to use any remaining testing supplies to test some BGs after delivery, and to have BG tested ideally annually, as well as prior to attempting future pregnancies.

## 2017-10-18 NOTE — Patient Instructions (Signed)
   Continue to make healthy food choices, great job!  Aim for at least 30grams, up to 45grams of carb with each meal.

## 2017-10-31 ENCOUNTER — Other Ambulatory Visit: Payer: Self-pay | Admitting: Obstetrics & Gynecology

## 2017-10-31 NOTE — Progress Notes (Unsigned)
Orders for IOL 

## 2017-11-17 LAB — OB RESULTS CONSOLE GC/CHLAMYDIA
CHLAMYDIA, DNA PROBE: NEGATIVE
Gonorrhea: NEGATIVE

## 2017-11-17 LAB — OB RESULTS CONSOLE RPR: RPR: NONREACTIVE

## 2017-11-17 LAB — OB RESULTS CONSOLE GBS: GBS: POSITIVE

## 2017-11-17 LAB — OB RESULTS CONSOLE HIV ANTIBODY (ROUTINE TESTING): HIV: NONREACTIVE

## 2017-12-01 ENCOUNTER — Other Ambulatory Visit: Payer: Self-pay

## 2017-12-01 ENCOUNTER — Inpatient Hospital Stay
Admission: EM | Admit: 2017-12-01 | Discharge: 2017-12-03 | DRG: 806 | Disposition: A | Discharge status: Home / Self Care | Payer: BC Managed Care – PPO | Attending: Obstetrics and Gynecology | Admitting: Obstetrics and Gynecology

## 2017-12-01 ENCOUNTER — Inpatient Hospital Stay: Payer: BC Managed Care – PPO | Admitting: Anesthesiology

## 2017-12-01 DIAGNOSIS — Z7901 Long term (current) use of anticoagulants: Secondary | ICD-10-CM | POA: Diagnosis not present

## 2017-12-01 DIAGNOSIS — Z3A39 39 weeks gestation of pregnancy: Secondary | ICD-10-CM | POA: Diagnosis not present

## 2017-12-01 DIAGNOSIS — O9912 Other diseases of the blood and blood-forming organs and certain disorders involving the immune mechanism complicating childbirth: Secondary | ICD-10-CM | POA: Diagnosis present

## 2017-12-01 DIAGNOSIS — O24420 Gestational diabetes mellitus in childbirth, diet controlled: Principal | ICD-10-CM | POA: Diagnosis present

## 2017-12-01 DIAGNOSIS — E669 Obesity, unspecified: Secondary | ICD-10-CM | POA: Diagnosis present

## 2017-12-01 DIAGNOSIS — D6851 Activated protein C resistance: Secondary | ICD-10-CM | POA: Diagnosis present

## 2017-12-01 DIAGNOSIS — O99214 Obesity complicating childbirth: Secondary | ICD-10-CM | POA: Diagnosis present

## 2017-12-01 DIAGNOSIS — Z86718 Personal history of other venous thrombosis and embolism: Secondary | ICD-10-CM

## 2017-12-01 LAB — COMPREHENSIVE METABOLIC PANEL
ALK PHOS: 153 U/L — AB (ref 38–126)
ALT: 20 U/L (ref 0–44)
AST: 26 U/L (ref 15–41)
Albumin: 3.1 g/dL — ABNORMAL LOW (ref 3.5–5.0)
Anion gap: 9 (ref 5–15)
BILIRUBIN TOTAL: 0.6 mg/dL (ref 0.3–1.2)
BUN: 9 mg/dL (ref 6–20)
CALCIUM: 9.4 mg/dL (ref 8.9–10.3)
CHLORIDE: 107 mmol/L (ref 98–111)
CO2: 20 mmol/L — ABNORMAL LOW (ref 22–32)
CREATININE: 0.6 mg/dL (ref 0.44–1.00)
Glucose, Bld: 75 mg/dL (ref 70–99)
Potassium: 3.8 mmol/L (ref 3.5–5.1)
Sodium: 136 mmol/L (ref 135–145)
TOTAL PROTEIN: 6.6 g/dL (ref 6.5–8.1)

## 2017-12-01 LAB — GLUCOSE, CAPILLARY
GLUCOSE-CAPILLARY: 98 mg/dL (ref 70–99)
Glucose-Capillary: 83 mg/dL (ref 70–99)
Glucose-Capillary: 71 mg/dL (ref 70–99)
Glucose-Capillary: 83 mg/dL (ref 70–99)

## 2017-12-01 LAB — TYPE AND SCREEN
ABO/RH(D): A POS
ANTIBODY SCREEN: NEGATIVE

## 2017-12-01 LAB — CBC
HEMATOCRIT: 36.5 % (ref 35.0–47.0)
Hemoglobin: 12.7 g/dL (ref 12.0–16.0)
MCH: 30.9 pg (ref 26.0–34.0)
MCHC: 34.8 g/dL (ref 32.0–36.0)
MCV: 88.8 fL (ref 80.0–100.0)
PLATELETS: 191 10*3/uL (ref 150–440)
RBC: 4.11 MIL/uL (ref 3.80–5.20)
RDW: 14.6 % — ABNORMAL HIGH (ref 11.5–14.5)
WBC: 11.5 10*3/uL — AB (ref 3.6–11.0)

## 2017-12-01 MED ORDER — FENTANYL 2.5 MCG/ML W/ROPIVACAINE 0.15% IN NS 100 ML EPIDURAL (ARMC)
EPIDURAL | Status: AC
  Filled 2017-12-01: qty 100

## 2017-12-01 MED ORDER — LIDOCAINE HCL (PF) 1 % IJ SOLN
INTRAMUSCULAR | Status: DC | PRN
  Administered 2017-12-01 (×2): 1.2 mL

## 2017-12-01 MED ORDER — LACTATED RINGERS IV SOLN: 500.0000 mL | Freq: Once | INTRAVENOUS | Status: DC

## 2017-12-01 MED ORDER — LIDOCAINE HCL (PF) 1 % IJ SOLN: 30.0000 mL | INTRAMUSCULAR | Status: DC | PRN

## 2017-12-01 MED ORDER — LACTATED RINGERS IV SOLN
INTRAVENOUS | Status: DC
  Administered 2017-12-01 (×2): via INTRAVENOUS

## 2017-12-01 MED ORDER — LIDOCAINE-EPINEPHRINE (PF) 1.5 %-1:200000 IJ SOLN
INTRAMUSCULAR | Status: DC | PRN
  Administered 2017-12-01: 3 mL via EPIDURAL

## 2017-12-01 MED ORDER — OXYTOCIN BOLUS FROM INFUSION
500.0000 mL | Freq: Once | INTRAVENOUS | Status: AC
  Administered 2017-12-01: 500 mL via INTRAVENOUS

## 2017-12-01 MED ORDER — TERBUTALINE SULFATE 1 MG/ML IJ SOLN: 0.2500 mg | Freq: Once | INTRAMUSCULAR | Status: DC | PRN

## 2017-12-01 MED ORDER — BUTORPHANOL TARTRATE 1 MG/ML IJ SOLN
1.0000 mg | INTRAMUSCULAR | Status: DC | PRN
  Administered 2017-12-01 (×2): 1 mg via INTRAVENOUS
  Filled 2017-12-01 (×2): qty 1

## 2017-12-01 MED ORDER — ONDANSETRON HCL 4 MG/2ML IJ SOLN
4.0000 mg | Freq: Four times a day (QID) | INTRAMUSCULAR | Status: DC | PRN
  Administered 2017-12-01: 4 mg via INTRAVENOUS
  Filled 2017-12-01: qty 2

## 2017-12-01 MED ORDER — SOD CITRATE-CITRIC ACID 500-334 MG/5ML PO SOLN: 30.0000 mL | ORAL | Status: DC | PRN

## 2017-12-01 MED ORDER — FENTANYL 2.5 MCG/ML W/ROPIVACAINE 0.15% IN NS 100 ML EPIDURAL (ARMC)
EPIDURAL | Status: DC | PRN
  Administered 2017-12-01: 12 mL/h via EPIDURAL

## 2017-12-01 MED ORDER — PHENYLEPHRINE 40 MCG/ML (10ML) SYRINGE FOR IV PUSH (FOR BLOOD PRESSURE SUPPORT)
80.0000 ug | PREFILLED_SYRINGE | INTRAVENOUS | Status: DC | PRN
  Filled 2017-12-01: qty 5

## 2017-12-01 MED ORDER — EPHEDRINE 5 MG/ML INJ
10.0000 mg | INTRAVENOUS | Status: DC | PRN
  Filled 2017-12-01: qty 2

## 2017-12-01 MED ORDER — FENTANYL 2.5 MCG/ML W/ROPIVACAINE 0.15% IN NS 100 ML EPIDURAL (ARMC)
12.0000 mL/h | EPIDURAL | Status: DC
  Administered 2017-12-01: 12 mL/h via EPIDURAL
  Filled 2017-12-01: qty 100

## 2017-12-01 MED ORDER — LACTATED RINGERS IV SOLN: 500.0000 mL | INTRAVENOUS | Status: DC | PRN

## 2017-12-01 MED ORDER — OXYTOCIN 40 UNITS IN LACTATED RINGERS INFUSION - SIMPLE MED
2.5000 [IU]/h | INTRAVENOUS | Status: DC
  Filled 2017-12-01 (×2): qty 1000

## 2017-12-01 MED ORDER — PENICILLIN G POTASSIUM 5000000 UNITS IJ SOLR
5.0000 10*6.[IU] | Freq: Once | INTRAMUSCULAR | Status: AC
  Administered 2017-12-01: 5 10*6.[IU] via INTRAVENOUS
  Filled 2017-12-01: qty 5

## 2017-12-01 MED ORDER — ACETAMINOPHEN 325 MG PO TABS
650.0000 mg | ORAL_TABLET | ORAL | Status: DC | PRN
  Administered 2017-12-02: 650 mg via ORAL
  Filled 2017-12-01: qty 2

## 2017-12-01 MED ORDER — PENICILLIN G POT IN DEXTROSE 60000 UNIT/ML IV SOLN
3.0000 10*6.[IU] | INTRAVENOUS | Status: DC
  Administered 2017-12-01 (×4): 3 10*6.[IU] via INTRAVENOUS
  Filled 2017-12-01 (×10): qty 50

## 2017-12-01 MED ORDER — MISOPROSTOL 25 MCG QUARTER TABLET
25.0000 ug | ORAL_TABLET | ORAL | Status: DC | PRN
Start: 2017-12-01 — End: 2017-12-02
  Administered 2017-12-01 (×2): 25 ug via VAGINAL
  Filled 2017-12-01 (×3): qty 1

## 2017-12-01 MED ORDER — DIPHENHYDRAMINE HCL 50 MG/ML IJ SOLN: 12.5000 mg | INTRAMUSCULAR | Status: DC | PRN

## 2017-12-01 NOTE — Anesthesia Preprocedure Evaluation (Signed)
Anesthesia Evaluation  Patient identified by MRN, date of birth, ID band Patient awake    Reviewed: Allergy & Precautions, NPO status , Patient's Chart, lab work & pertinent test results  History of Anesthesia Complications Negative for: history of anesthetic complications  Airway Mallampati: III       Dental   Pulmonary neg sleep apnea, neg COPD,           Cardiovascular + Valvular Problems/Murmurs (murmur)      Neuro/Psych neg Seizures    GI/Hepatic Neg liver ROS, neg GERD  ,  Endo/Other  diabetes, Gestational  Renal/GU negative Renal ROS     Musculoskeletal   Abdominal   Peds  Hematology  (+) Blood dyscrasia (leyden factor deficiency, 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), ,   Anesthesia Other Findings   Reproductive/Obstetrics                             Anesthesia Physical Anesthesia Plan  ASA: II  Anesthesia Plan: Epidural   Post-op Pain Management:    Induction:   PONV Risk Score and Plan:   Airway Management Planned:   Additional Equipment:   Intra-op Plan:   Post-operative Plan:   Informed Consent: I have reviewed the patients History and Physical, chart, labs and discussed the procedure including the risks, benefits and alternatives for the proposed anesthesia with the patient or authorized representative who has indicated his/her understanding and acceptance.     Plan Discussed with:   Anesthesia Plan Comments:         Anesthesia Quick Evaluation

## 2017-12-01 NOTE — Anesthesia Procedure Notes (Signed)
Epidural Patient location during procedure: OB Start time: 12/01/2017 11:22 AM End time: 12/01/2017 12:17 PM  Staffing Performed: anesthesiologist and resident/CRNA   Preanesthetic Checklist Completed: patient identified, site marked, surgical consent, pre-op evaluation, timeout performed, IV checked, risks and benefits discussed and monitors and equipment checked  Epidural Patient position: sitting Prep: Betadine Patient monitoring: heart rate, continuous pulse ox and blood pressure Approach: midline Location: L3-L4 Injection technique: LOR saline  Needle:  Needle type: Tuohy  Needle gauge: 17 G Needle length: 9 cm and 9 Needle insertion depth: 7 cm Catheter type: closed end flexible Catheter size: 20 Guage Catheter at skin depth: 11 cm Test dose: negative and 1.5% lidocaine with Epi 1:200 K  Assessment Events: blood not aspirated, injection not painful, no injection resistance, negative IV test and no paresthesia  Additional Notes Several attempts by CRNA unable to thread catheter. On moving up about 1 cm able to enter epidural space and catherter threaded.  Patient tolerated the insertion well without complications.Reason for block:procedure for pain

## 2017-12-01 NOTE — H&P (Signed)
OB History & Physical   History of Present Illness:  Chief Complaint: Induction of labor  HPI:  Hannah Patterson is a 27 y.o. G1P0000 female at 1185w0d dated by LMP and c/w 1920w1d US.  She presents to L&D for Induction of labor due to GDM and hx Factor V Leiden mutation.   Currently reports active FM;  Feeling cramping with UCs, denies LOF or SROM; and denies bloody show.     Pregnancy Issues: 1. GDM- diet controlled 2. Factor V Leiden mutation, Heterozygous; with hx DVT, on Lovenox daily 3. BMI 37 pre-preg 4. Rubella non-immune   Maternal Medical History:   Past Medical History:  Diagnosis Date  . DVT (deep venous thrombosis) (HCC)   . Factor V Leiden (HCC)    heterozygous  . Gestational diabetes   . Heart murmur     History reviewed. No pertinent surgical history.  Allergies  Allergen Reactions  . Cat Hair Extract Swelling    Prior to Admission medications   Medication Sig Start Date End Date Taking? Authorizing Provider  acetaminophen (TYLENOL) 650 MG CR tablet Take 650 mg by mouth every 8 (eight) hours as needed.   Yes [provider]  enoxaparin (LOVENOX) 60 MG/0.6ML injection Inject 60 mg into the skin daily. 06/23/17  Yes [provider]  glucose blood (PRECISION QID TEST) test strip Use 4 (four) times daily Use as instructed. 10/06/17 10/06/18 Yes [provider]  PRENATAL 28-0.8 MG TABS Take 1 tablet by mouth daily.   Yes [provider]  metroNIDAZOLE (FLAGYL) 500 MG tablet  10/17/17   [provider]     [facility]    Social History: She  reports that she has never smoked. She has never used smokeless tobacco. She reports that she does not drink alcohol or use drugs.  Family History: family history includes Alcohol abuse in her father; Cancer in her maternal aunt; Diabetes in her father; Hypertension in her mother; Miscarriages / Stillbirths in her paternal grandmother; Pulmonary  embolism in her maternal grandmother.   Review of Systems: A full review of systems was performed and negative except as noted in the HPI.     Physical Exam:  Vital Signs: BP 114/78   Pulse 73   Temp 98.2 F (36.8 C) (Oral)   Resp 18   Ht 5' (1.524 m)   Wt 203 lb (92.1 kg)   LMP 03/03/2017   BMI 39.65 kg/m  General: no acute distress.  HEENT: normocephalic, atraumatic Heart: regular rate & rhythm.  No murmurs/rubs/gallops Lungs: clear to auscultation bilaterally, normal respiratory effort Abdomen: soft, gravid, non-tender;  EFW: 3500g Pelvic:   External: Normal external female genitalia  Cervix: Dilation: 2 / Effacement (%): 50 / Station: -3    Extremities: non-tender, symmetric, no edema bilaterally.  DTRs: 2+ Neurologic: Alert & oriented x 3.    Results for orders placed or performed during the hospital encounter of 12/01/17 (from the past 24 hour(s))  CBC     Status: Abnormal   Collection Time: 12/01/17 12:50 AM  Result Value Ref Range   WBC 11.5 (H) 3.6 - 11.0 K/uL   RBC 4.11 3.80 - 5.20 MIL/uL   Hemoglobin 12.7 12.0 - 16.0 g/dL   HCT 16.136.5 09.635.0 - 04.547.0 %   MCV 88.8 80.0 - 100.0 fL   MCH 30.9 26.0 - 34.0 pg   MCHC 34.8 32.0 - 36.0 g/dL   RDW 40.914.6 (H) 81.111.5 - 91.414.5 %  Platelets 191 150 - 440 K/uL  Comprehensive metabolic panel     Status: Abnormal   Collection Time: 12/01/17 12:50 AM  Result Value Ref Range   Sodium 136 135 - 145 mmol/L   Potassium 3.8 3.5 - 5.1 mmol/L   Chloride 107 98 - 111 mmol/L   CO2 20 (L) 22 - 32 mmol/L   Glucose, Bld 75 70 - 99 mg/dL   BUN 9 6 - 20 mg/dL   Creatinine, Ser 1.61 0.44 - 1.00 mg/dL   Calcium 9.4 8.9 - 09.6 mg/dL   Total Protein 6.6 6.5 - 8.1 g/dL   Albumin 3.1 (L) 3.5 - 5.0 g/dL   AST 26 15 - 41 U/L   ALT 20 0 - 44 U/L   Alkaline Phosphatase 153 (H) 38 - 126 U/L   Total Bilirubin 0.6 0.3 - 1.2 mg/dL   GFR calc non Af Amer >60 >60 mL/min   GFR calc Af Amer >60 >60 mL/min   Anion gap 9 5 - 15  Type and screen     Status:  None (Preliminary result)   Collection Time: 12/01/17 12:50 AM  Result Value Ref Range   ABO/RH(D) PENDING    Antibody Screen PENDING    Sample Expiration      12/04/2017 Performed at [facility], [address] [place], [place] [address]   Type and screen     Status: None   Collection Time: 12/01/17  1:16 AM  Result Value Ref Range   ABO/RH(D) A POS    Antibody Screen NEG    Sample Expiration      12/04/2017 Performed at [facility], [address] [place], [place] [address]   Glucose, capillary     Status: None   Collection Time: 12/01/17  2:01 AM  Result Value Ref Range   Glucose-Capillary 83 70 - 99 mg/dL    Pertinent Results:  Prenatal Labs: Blood type/Rh  A Pos  Antibody screen neg  Rubella  NON-Immune  Varicella Immune  RPR NR  HBsAg Neg  HIV NR  GC neg  Chlamydia neg  Genetic screening negative  1 hour GTT  151  3 hour GTT  [id number]  GBS  POS   FHT: 135bpm, mod variability, + accels, no decels TOCO: q1-71min with coupling SVE:  Dilation: 2 / Effacement (%): 50 / Station: -3 with last exam at 0130 per nursing.   Cephalic by leopolds  No results found.  Assessment:  Hannah Patterson is a 27 y.o. G1P0000 female at [redacted]w[redacted]d with IOL due to GDM and factor V leiden.   Plan:  1. Admit to Labor & Delivery; consents reviewed and obtained  2. Fetal Well being  - Fetal Tracing: Cat I  - Group B Streptococcus ppx indicated: Pos, Abx to start at ROM or active labor. - Presentation: cephalic confirmed by exam and Leopolds  3. Routine OB: - Prenatal labs reviewed, as above - Rh A Pos - CBC, T&S, RPR on admit - Clear fluids, IVF  4. Induction of Labor -  Contractions: external toco in place -  Pelvis unproven, adequate for TOL -  Plan for induction with Cytotec x 2 doses now, last at 0500 -  Plan for continuous fetal monitoring  -  Maternal pain control as desired; discussed IVPM, nitrous, regional anesthesia - Anticipate  vaginal delivery  5. High risk issues - GDM- CBG check q4hrs until active labor - Hx DVT: management per MFM recs:  - 60mg  enoxaparin  daily, hold 24 hours prior to planned delivery (last dose given 7/4 at 0830, resume 12h s/p SVD or 24h s/p C/S, pneumatic compresssion devices while off anticoagulation - Needs Anticoagulation 6-12 weeks postpartum, can bridge to warfarin if 12w with bridging at least 2w PP.   McVey, REBECCA A, CNM 12/01/17 6:43 AM

## 2017-12-02 DIAGNOSIS — E669 Obesity, unspecified: Secondary | ICD-10-CM | POA: Diagnosis present

## 2017-12-02 LAB — RPR: RPR Ser Ql: NONREACTIVE

## 2017-12-02 LAB — CBC
HEMATOCRIT: 35.9 % (ref 35.0–47.0)
Hemoglobin: 12.2 g/dL (ref 12.0–16.0)
MCH: 30.7 pg (ref 26.0–34.0)
MCHC: 34 g/dL (ref 32.0–36.0)
MCV: 90.4 fL (ref 80.0–100.0)
Platelets: 181 10*3/uL (ref 150–440)
RBC: 3.97 MIL/uL (ref 3.80–5.20)
RDW: 14.8 % — AB (ref 11.5–14.5)
WBC: 16 10*3/uL — AB (ref 3.6–11.0)

## 2017-12-02 MED ORDER — DIPHENHYDRAMINE HCL 25 MG PO CAPS: 25.0000 mg | ORAL_CAPSULE | Freq: Four times a day (QID) | ORAL | Status: DC | PRN

## 2017-12-02 MED ORDER — IBUPROFEN 600 MG PO TABS: 600.0000 mg | ORAL_TABLET | Freq: Four times a day (QID) | ORAL | Status: DC

## 2017-12-02 MED ORDER — ONDANSETRON HCL 4 MG/2ML IJ SOLN: 4.0000 mg | INTRAMUSCULAR | Status: DC | PRN

## 2017-12-02 MED ORDER — ENOXAPARIN SODIUM 40 MG/0.4ML ~~LOC~~ SOLN
30.0000 mg | Freq: Two times a day (BID) | SUBCUTANEOUS | Status: DC
  Filled 2017-12-02: qty 0.4

## 2017-12-02 MED ORDER — BENZOCAINE-MENTHOL 20-0.5 % EX AERO
1.0000 "application " | INHALATION_SPRAY | CUTANEOUS | Status: DC | PRN
  Administered 2017-12-02: 1 via TOPICAL
  Filled 2017-12-02: qty 56

## 2017-12-02 MED ORDER — COCONUT OIL OIL
1.0000 "application " | TOPICAL_OIL | Status: DC | PRN
  Filled 2017-12-02: qty 120

## 2017-12-02 MED ORDER — DOCUSATE SODIUM 100 MG PO CAPS
100.0000 mg | ORAL_CAPSULE | Freq: Two times a day (BID) | ORAL | Status: DC
  Administered 2017-12-02 – 2017-12-03 (×4): 100 mg via ORAL
  Filled 2017-12-02 (×4): qty 1

## 2017-12-02 MED ORDER — ACETAMINOPHEN 500 MG PO TABS
1000.0000 mg | ORAL_TABLET | Freq: Four times a day (QID) | ORAL | Status: DC | PRN
  Administered 2017-12-02: 1000 mg via ORAL
  Filled 2017-12-02 (×3): qty 2

## 2017-12-02 MED ORDER — ENOXAPARIN SODIUM 30 MG/0.3ML ~~LOC~~ SOLN
30.0000 mg | Freq: Two times a day (BID) | SUBCUTANEOUS | Status: DC
  Administered 2017-12-02 – 2017-12-03 (×3): 30 mg via SUBCUTANEOUS
  Filled 2017-12-02 (×4): qty 0.3

## 2017-12-02 MED ORDER — WITCH HAZEL-GLYCERIN EX PADS
1.0000 "application " | MEDICATED_PAD | CUTANEOUS | Status: DC
  Administered 2017-12-02: 1 via TOPICAL
  Filled 2017-12-02: qty 100

## 2017-12-02 MED ORDER — ONDANSETRON HCL 4 MG PO TABS: 4.0000 mg | ORAL_TABLET | ORAL | Status: DC | PRN

## 2017-12-02 MED ORDER — SIMETHICONE 80 MG PO CHEW: 80.0000 mg | CHEWABLE_TABLET | ORAL | Status: DC | PRN

## 2017-12-02 MED ORDER — PRENATAL MULTIVITAMIN CH
1.0000 | ORAL_TABLET | Freq: Every day | ORAL | Status: DC
  Administered 2017-12-02: 1 via ORAL
  Filled 2017-12-02: qty 1

## 2017-12-02 NOTE — Plan of Care (Signed)
Patient's vital signs stable; fundus firm; small amount rubra lochia; voiding; tolerating regular diet well; good po fluid intake; breastfeeding infant with good technique observed; good maternal-infant bonding observed; husband at bedside and attentive; IV fluids with Pitocin continued; IV site clear.

## 2017-12-02 NOTE — Discharge Summary (Signed)
Obstetrical Discharge Summary  Patient Name: Hannah Patterson DOB: [id number] MRN: [id number]  Date of Admission: 12/01/2017 Date of Delivery: 12/01/17 Delivered by: Larey Days, MD Date of Discharge: 12/03/2017  Primary OB: [place] APO:LIDCVUD'T last menstrual period was 03/03/2017. EDC Estimated Date of Delivery: 12/08/17 Gestational Age at Delivery: [redacted]w[redacted]d  Antepartum complications:   1. GDM- diet controlled 2. Factor V Leiden mutation, Heterozygous; with hx DVT, on Lovenox daily 3. BMI 37 pre-preg 4. Rubella non-immune 5. Obesity BMI 39  Admitting Diagnosis:  IOL for GDMA  Secondary Diagnosis: Patient Active Problem List   Diagnosis Date Noted  . Obesity (BMI 30-39.9) 12/02/2017  . Labor and delivery, indication for care 12/01/2017  . [place]   . [place] 10[place]  . Acute deep vein thrombosis (DVT) of popliteal vein of left lower extremity (HCC) 08/17/2016    Augmentation: AROM, Pitocin and Cytotec Complications: None Intrapartum complications/course: Mom presented to L&D with IOL for GDMA and Factor V Leiden mutation.  Lovenox had been last administered 24hrs before induction.  She was given cytotec x2 doses, then AROM, then augmented with pitocin.  epidual placed. Due to persistent variables/Category 2 tracing, pitocin was discontinued.  She  progressed to complete, and labored down. Active second stage: 40 mins, with delivery of fetal head with restitution to ROT.   Anterior then posterior shoulders delivered without difficulty. Due to short cord, baby was held at the perineum, and attended to by peds during delayed cord clamping.   Cord was then clamped and cut by FOB and placed on mom's chest.  Placenta spontaneously delivered, intact.   IV pitocin given for hemorrhage prophylaxis. 3-0 vicryl placed in a figure-of-eight for hemostasis in the posterior vaginal mucosa. Date of Delivery:  Delivered By: CVikki Ports Ward Delivery Type: spontaneous vaginal delivery Anesthesia: epidural Placenta: sponatneous Laceration:  Episiotomy: none Newborn Data: Live born female  Birth Weight: 5 lb 9.2 oz (2530 g) APGAR: 8, 9  Newborn Delivery   Birth date/time:  12/01/2017 22:19:00 Delivery type:  Vaginal, Spontaneous     Postpartum Procedures:  Lovenox 30 BID postpartum, MMR  Post partum course:  Patient had an uncomplicated postpartum course.  By time of discharge on PPD#2, her pain was controlled on oral pain medications; she had appropriate lochia and was ambulating, voiding without difficulty and tolerating regular diet.  She was deemed stable for discharge to home.     Discharge Physical Exam:  BP 110/76 (BP Location: Right Arm)   Pulse 81   Temp 98 F (36.7 C) (Oral)   Resp 20   Ht 5' (1.524 m)   Wt 92.1 kg (203 lb)   LMP 03/03/2017   SpO2 99%   Breastfeeding? Unknown   BMI 39.65 kg/m   General: NAD CV: RRR Pulm: CTABL, nl effort ABD: s/nd/nt, fundus firm and below the umbilicus Lochia: moderate DVT Evaluation: LE non-ttp, no evidence of DVT on exam.  Hemoglobin  Date Value Ref Range Status  12/02/2017 12.2 12.0 - 16.0 g/dL Final   HCT  Date Value Ref Range Status  12/02/2017 35.9 35.0 - 47.0 % Final     Disposition: stable, discharge to home. Baby Feeding: breastmilk Baby Disposition: home with mom  Rh Immune globulin given: n/a Rubella vaccine given: n/a Tdap vaccine given in AP r PP setting: AP Flu vaccine given in AP or PP setting: AP  Contraception: condoms  Prenatal Labs:   Blood type/Rh  A Pos  Antibody screen neg  Rubella  NON-Immune  Varicella Immune  RPR NR  HBsAg Neg  HIV NR  GC neg  Chlamydia neg  Genetic screening negative  1 hour GTT  151  3 hour GTT  [id number]  GBS  POS     Plan:  Hannah Patterson was discharged to home in good condition. Follow-up appointment with Dr. Leonides Schanz in 4-6 weeks.  Discharge Medications: Allergies as of  12/03/2017      Reactions   Cat Hair Extract Swelling      Medication List    STOP taking these medications   metroNIDAZOLE 500 MG tablet Commonly known as:  FLAGYL   PRECISION QID TEST test strip Generic drug:  glucose blood     TAKE these medications   acetaminophen 650 MG CR tablet Commonly known as:  TYLENOL Take 650 mg by mouth every 8 (eight) hours as needed.   enoxaparin 60 MG/0.6ML injection Commonly known as:  LOVENOX Inject 60 mg into the skin daily.   ibuprofen 600 MG tablet Commonly known as:  ADVIL,MOTRIN Take 1 tablet (600 mg total) by mouth every 6 (six) hours as needed.   PRENATAL 28-0.8 MG Tabs Take 1 tablet by mouth daily.       Follow-up Information    Ward, Honor Loh, MD Follow up in 6 week(s).   Specialty:  Obstetrics and Gynecology Why:  routine postpartum appointment in 4-6 weeks Contact information: [place] [address] [phone]           Signed: ----- Larey Days, MD Attending Obstetrician and Gynecologist [facility], Department of [facility]

## 2017-12-02 NOTE — Plan of Care (Signed)
  Problem: Education: Goal: Knowledge of General Education information will improve Outcome: Progressing   Problem: Life Cycle: Goal: Chance of risk for complications during the postpartum period will decrease Outcome: Progressing   Problem: Role Relationship: Goal: Ability to demonstrate positive interaction with newborn will improve Outcome: Progressing   Problem: Coping: Goal: Ability to identify and utilize available resources and services will improve Outcome: Progressing

## 2017-12-02 NOTE — Lactation Note (Signed)
This note was copied from a baby's chart. Lactation Consultation Note  Patient Name: Hannah Patterson Today's Date: 12/02/2017 Reason for consult: Initial assessment;Primapara;Term;Mother's request;Infant < 6lbs;Other (Comment);Difficult latch(Sleepy after circumcision) Assisted mom with waking Hannah Patterson to put on right breast in football hold with pillow support skin to skin.  Once we got him awake he latched well and began good rhythmic sucking and swallowing for 5 minutes.  He slowed down with sucking as he was having a bowel movement.  After changing void and stool, he latched to left breast in modified cross cradle hold skin to skin.  Temperature taken and had risen to 98.2.  He still had a strong suck according to mom and continued past 5 minutes on 2nd breast.  Reviewed supply and demand, normal course of lactation and routine newborn feeding patterns.  Mom already has [place] [employer] from [employer] and wants demonstration before discharge.  Lactation name and number written on white board and encouraged to call for questions, concerns or assistance.    Maternal Data Formula Feeding for Exclusion: No Has patient been taught Hand Expression?: Yes(Can easily hand express colostrum) Does the patient have breastfeeding experience prior to this delivery?: No  Feeding Feeding Type: Breast Fed Length of feed: 5 min  LATCH Score Latch: Repeated attempts needed to sustain latch, nipple held in mouth throughout feeding, stimulation needed to elicit sucking reflex.  Audible Swallowing: A few with stimulation  Type of Nipple: Everted at rest and after stimulation  Comfort (Breast/Nipple): Soft / non-tender  Hold (Positioning): Assistance needed to correctly position infant at breast and maintain latch.  LATCH Score: 7  Interventions Interventions: Breast feeding basics reviewed;Reverse pressure;Assisted with latch;Breast compression;Skin to skin;Adjust position;Breast massage;Support  pillows;Hand express;Position options  Lactation Tools Discussed/Used WIC Program: No(BCBS)   Consult Status Consult Status: Follow-up Date: 12/02/17 Follow-up type: Call as needed    Hannah Patterson, Hannah Patterson Kay 12/02/2017, 2:09 PM

## 2017-12-02 NOTE — Anesthesia Postprocedure Evaluation (Signed)
Anesthesia Post Note  Patient: [name], quantityChantel Robyn Patterson  Procedure(s) Performed: AN AD HOC LABOR EPIDURAL  Patient location during evaluation: Mother Baby Anesthesia Type: Epidural Level of consciousness: awake and alert Pain management: satisfactory to patient Vital Signs Assessment: post-procedure vital signs reviewed and stable Respiratory status: spontaneous breathing, nonlabored ventilation and respiratory function stable Cardiovascular status: stable Postop Assessment: no headache, patient able to bend at knees, able to ambulate and adequate PO intake (Patient noted itching with epidural, now resolved.  Back soreness also noted) Anesthetic complications: no     Last Vitals:  Vitals:   12/02/17 0751 12/02/17 1208  BP: 116/79 116/73  Pulse: 74 73  Resp: 20 18  Temp: 36.7 C 36.7 C  SpO2: 100%     Last Pain:  Vitals:   12/02/17 1620  TempSrc:   PainSc: 0-No pain                 Lenard SimmerAndrew Bennie Chirico

## 2017-12-02 NOTE — Progress Notes (Signed)
Intrapartum progress note  Patient having pain with contractions, interested in epidural.  S/p 2 doses cytotec.    O: 114/78, 98.2, 73, 18 97% RA FHT: 130 mod + accels no decels TOCO: q723min per patient, not tracing well due to position SVE: 3/08/-1 AROM'd for clear fluid  A/P: 26yo G1P0 @ 39+0 with IOL for GDMA1 and factor V leiden mutation  1. IUP: category 1 2. IOL: s/p cytotec x2, AROM.  Will start pitocin after her epidural.  3. Continue SCDs while in labor 4. Continue active management.  ----- Ranae Plumberhelsea Destina Mantei, MD Attending Obstetrician and Gynecologist [facility], Department of OB/GYN [facility]

## 2017-12-02 NOTE — Progress Notes (Signed)
Post Partum Day 1 Subjective: Doing well, no complaints.  Tolerating regular diet, pain with PO meds, voiding and ambulating without difficulty.  No CP SOB F/C N/V or leg pain  Restarted her lovenox without any fanfare.  Objective: BP 116/73 (BP Location: Right Arm)   Pulse 73   Temp 98.1 F (36.7 C) (Oral)   Resp 18   Ht 5' (1.524 m)   Wt 92.1 kg (203 lb)   LMP 03/03/2017   SpO2 100%   Breastfeeding? Unknown   BMI 39.65 kg/m    Physical Exam:  General: NAD CV: RRR Pulm: nl effort, CTABL Lochia: moderate Uterine Fundus: fundus firm and below umbilicus DVT Evaluation: no cords, ttp LEs   Recent Labs    12/01/17 0050 12/02/17 0513  HGB 12.7 12.2  HCT 36.5 35.9  WBC 11.5* 16.0*  PLT 191 181    Assessment/Plan: 26 y.o. G1P1001 postpartum day # 1  1. Pospartum:  Doing well, continue routine care 2. Breastfeeding: lactation teaching/support 3. Factor V Leiden: restarted lovenox 30 BID, continue through 6 wks postpartum  Anticipate discharge tomorrow AM ----- [name], MD Attending Obstetrician and Gynecologist [facility]

## 2017-12-03 ENCOUNTER — Ambulatory Visit: Payer: Self-pay

## 2017-12-03 MED ORDER — MEASLES, MUMPS & RUBELLA VAC ~~LOC~~ INJ
0.5000 mL | INJECTION | Freq: Once | SUBCUTANEOUS | Status: AC
  Administered 2017-12-03: 0.5 mL via SUBCUTANEOUS
  Filled 2017-12-03: qty 0.5

## 2017-12-03 MED ORDER — IBUPROFEN 600 MG PO TABS: 600.0000 mg | ORAL_TABLET | Freq: Four times a day (QID) | ORAL | 0 refills | Status: DC | PRN

## 2017-12-03 NOTE — Lactation Note (Signed)
This note was copied from a baby's chart. Lactation Consultation Note  Patient Name: Hannah Patterson Today's Date: 12/03/2017 Reason for consult: Follow-up assessment;Other (Comment);Term;Mother's request;Infant < 6lbs(SGA) Observed one more breast feed before mom's discharge.  Mom latches independently.  [name] opens mouth wide with flanged lips for deep latch and begins strong rhythmic sucking and swallowing.  Required frequent stimulation to keep awake with breast massage by mom.  Mom asking appropriate questions about what to expect at home.  Lactation community resources reviewed and encouraged to call with any questions, concerns or assistance.  Maternal Data Formula Feeding for Exclusion: No Has patient been taught Hand Expression?: Yes Does the patient have breastfeeding experience prior to this delivery?: No  Feeding Feeding Type: Breast Fed Length of feed: 15 min  LATCH Score Latch: Repeated attempts needed to sustain latch, nipple held in mouth throughout feeding, stimulation needed to elicit sucking reflex.  Audible Swallowing: Spontaneous and intermittent  Type of Nipple: Everted at rest and after stimulation  Comfort (Breast/Nipple): Filling, red/small blisters or bruises, mild/mod discomfort  Hold (Positioning): No assistance needed to correctly position infant at breast.  LATCH Score: 8  Interventions Interventions: Assisted with latch;Reverse pressure;Coconut oil;Breast compression;Adjust position;Breast massage;Support pillows;Hand express;Position options  Lactation Tools Discussed/Used Tools: Coconut oil;Comfort gels;Pump Breast pump type: Double-Electric Breast Pump WIC Program: No(BCBS) Pump Review: Setup, frequency, and cleaning;Milk Storage Initiated by:: S.Wiliams,RN,BSN,IBCLC Date initiated:: 12/02/17   Consult Status Consult Status: PRN Follow-up type: Call as needed    Louis MeckelWilliams, Casyn Becvar Kay 12/03/2017, 1:17 PM

## 2017-12-03 NOTE — Discharge Instructions (Signed)
Discharge instructions:   Call office if you have any of the following: headache, visual changes, fever >101.0 F, chills, breast concerns, excessive vaginal bleeding, incision drainage or problems, leg pain or redness, depression or any other concerns.   Activity: Do not lift > 10 lbs for 6 weeks.  No intercourse or tampons for 6 weeks.  No driving for 1-2 weeks.   Call your doctor for increased pain or vaginal bleeding, temperature above 101.0, depression, or concerns.  No strenuous activity or heavy lifting for 6 weeks.  No intercourse, tampons, douching, or enemas for 6 weeks.  No tub baths-showers only.  No driving for 2 weeks or while taking pain medications.  Continue prenatal vitamin and iron.  Increase calories and fluids while breastfeeding.  You may have a slight fever when your milk comes in, but it should go away on its own.  If it does not, and rises above 101.0 please call the doctor.  For concerns about your baby, please call your pediatrician For breastfeeding concerns, the lactation consultant can be reached at [phone]  Continue your Lovenox daily for 6 weeks.

## 2017-12-03 NOTE — Progress Notes (Signed)
Discharge instructions given. Patient verbalizes understanding of teaching. Patient discharged home via wheelchair at 1300. 

## 2018-11-06 IMAGING — US US EXTREM LOW VENOUS*L*
1 series · 13 of 24 positions shown · non-contrast
Comparison: None.

CLINICAL DATA: Left lower extremity pain and swelling for 1 week.



[Series 1: us extrem low venous*left* · 0.08mm/px · 13 of 36 slices shown]
[im 1/36]
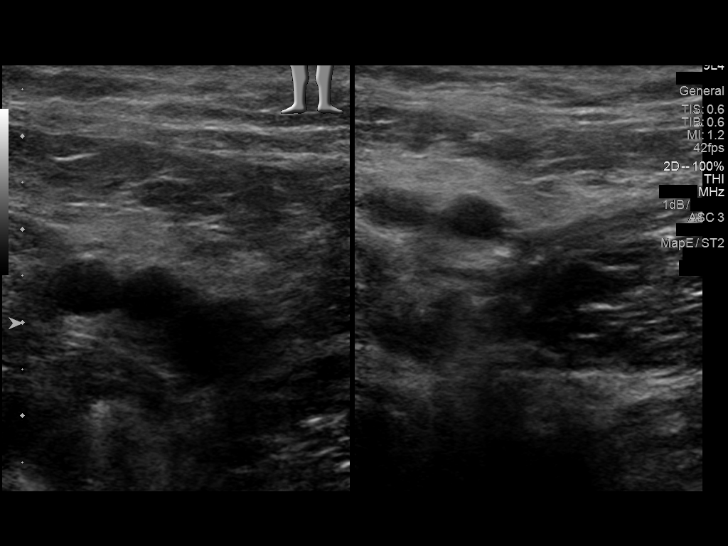
[im 4/36]
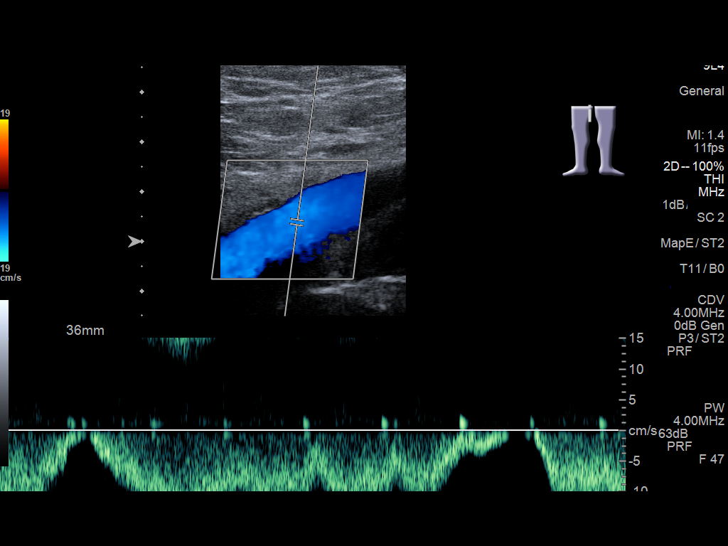
[im 7/36]
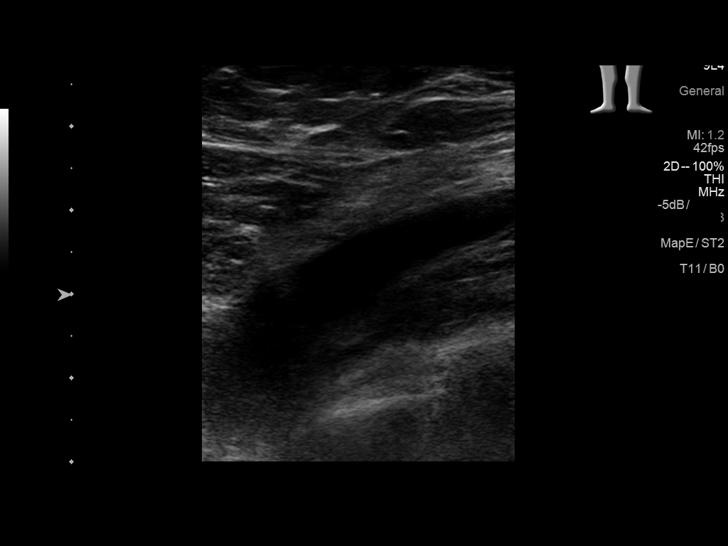
[im 10/36]
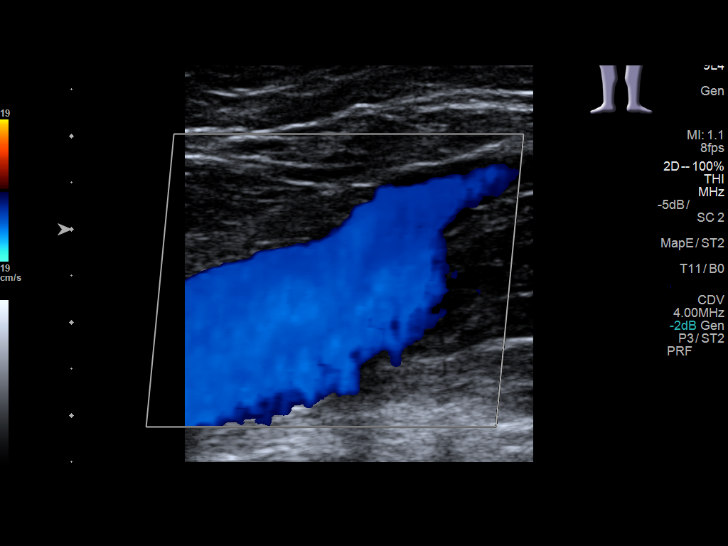
[im 13/36]
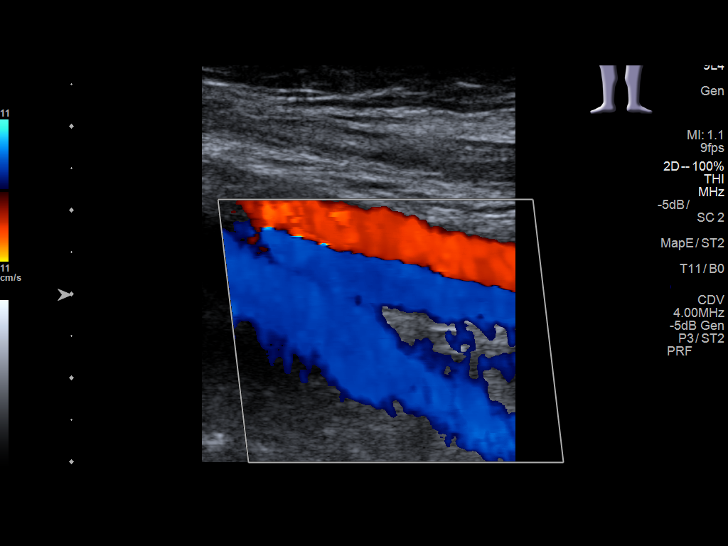
[im 16/36]
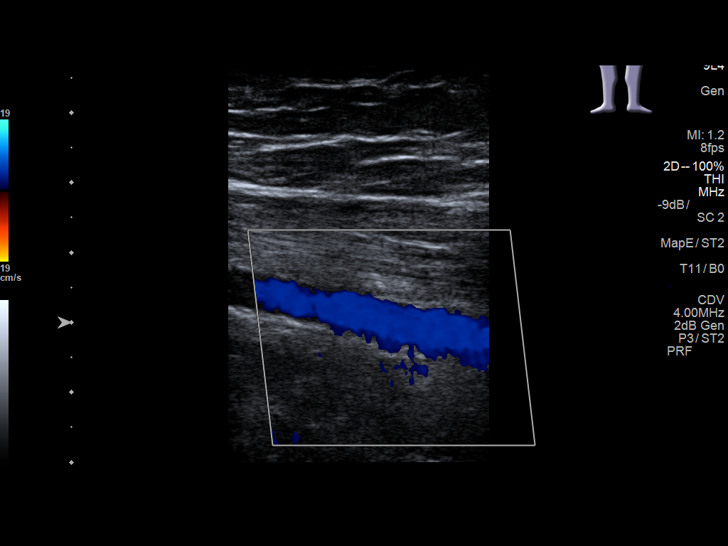
[im 19/36]
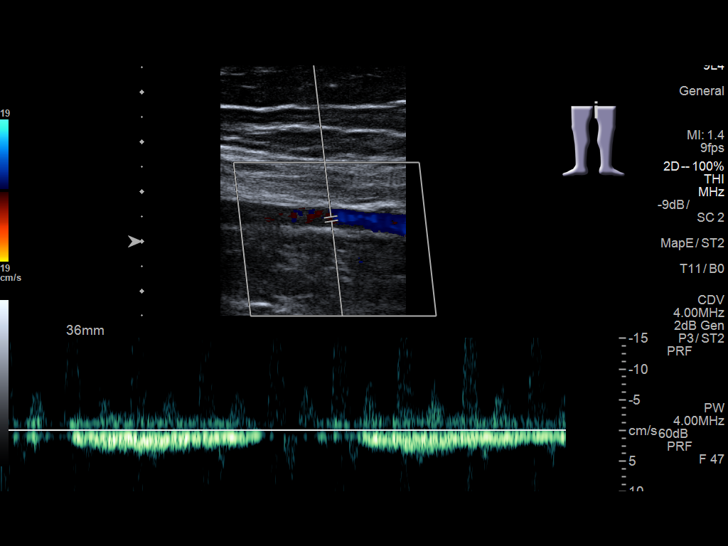
[im 20/36]
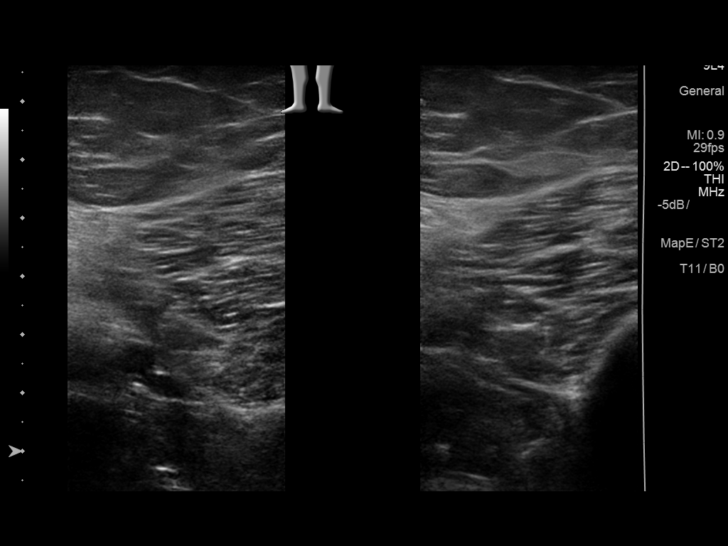
[im 23/36]
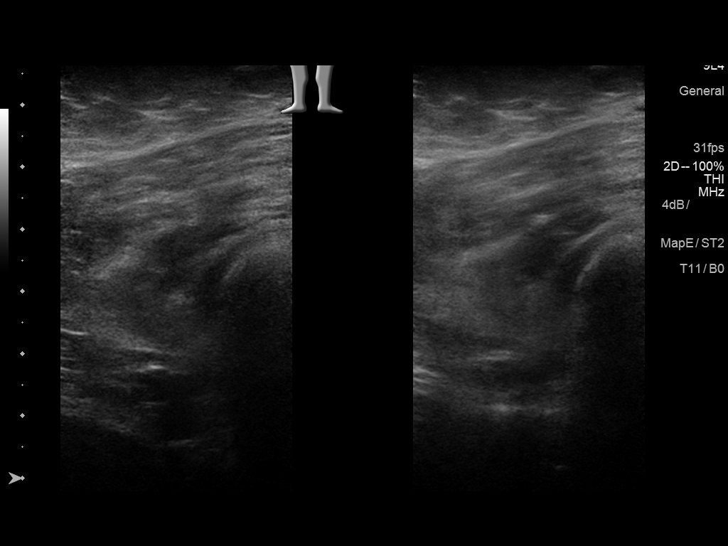
[im 26/36]
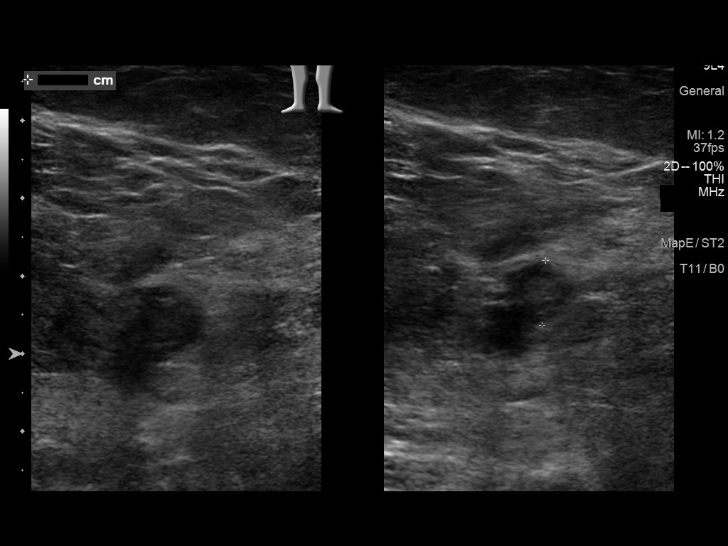
[im 29/36]
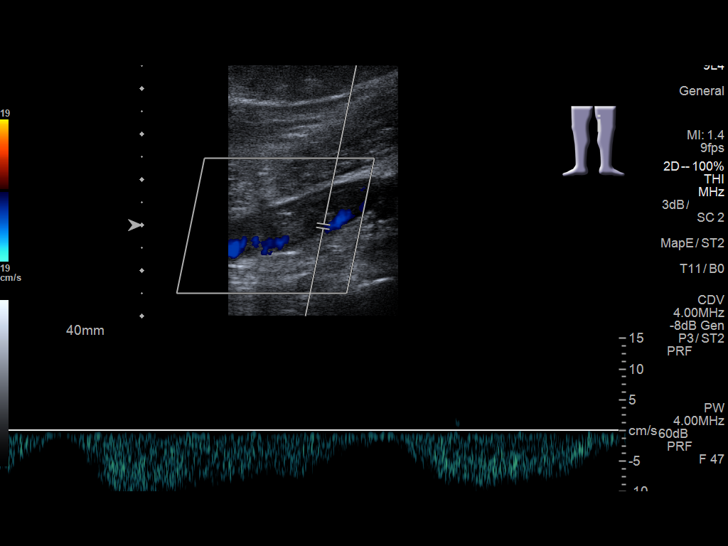
[im 32/36]
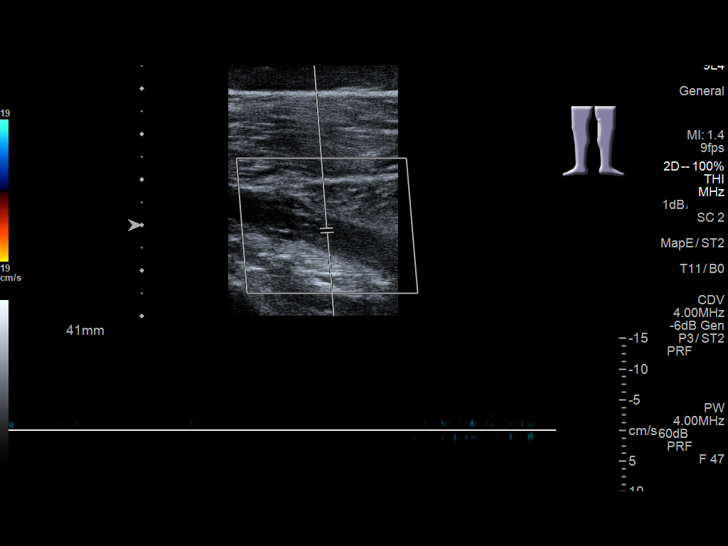
[im 36/36]
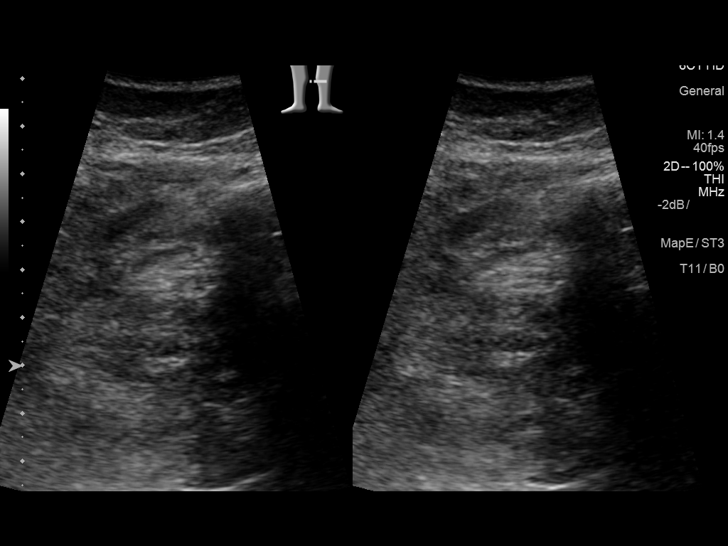

[13 of 24 positions shown; findings below may reference images not displayed]

FINDINGS: Contralateral Common Femoral Vein: Respiratory phasicity is normal
and symmetric with the symptomatic side. No evidence of thrombus.
Normal compressibility.

Common Femoral Vein: No evidence of thrombus. Normal
compressibility, respiratory phasicity and response to augmentation.

Saphenofemoral Junction: No evidence of thrombus. Normal
compressibility and flow on color Doppler imaging.

Profunda Femoral Vein: No evidence of thrombus. Normal
compressibility and flow on color Doppler imaging.

Femoral Vein: No evidence of thrombus. Normal compressibility,
respiratory phasicity and response to augmentation.

Popliteal Vein: Noncompressible consistent with occlusive
thrombosis.

Calf Veins: Posterior tibial veins demonstrate noncompressibility
consistent with occlusive thrombus. Peroneal veins are not well
visualized due to surrounding soft tissue swelling.

Superficial Great Saphenous Vein: No evidence of thrombus. Normal
compressibility and flow on color Doppler imaging.

Venous Reflux:  None.

Other Findings:  None.
IMPRESSION: Findings consistent with acute deep venous thrombosis involving the
left popliteal and posterior tibial veins. Attempt to contact
referring physician myself was unsuccessful. These results will be
called to the ordering clinician or representative by the
Radiologist Assistant, and communication documented in the PACS or
zVision Dashboard.

## 2019-04-22 ENCOUNTER — Ambulatory Visit
Admission: EM | Admit: 2019-04-22 | Discharge: 2019-04-22 | Disposition: A | Discharge status: Home / Self Care | Payer: Commercial Managed Care - PPO | Attending: Family Medicine | Admitting: Family Medicine

## 2019-04-22 ENCOUNTER — Other Ambulatory Visit: Payer: Self-pay

## 2019-04-22 DIAGNOSIS — R05 Cough: Secondary | ICD-10-CM | POA: Diagnosis not present

## 2019-04-22 DIAGNOSIS — B349 Viral infection, unspecified: Secondary | ICD-10-CM

## 2019-04-22 DIAGNOSIS — R519 Headache, unspecified: Secondary | ICD-10-CM

## 2019-04-22 DIAGNOSIS — J029 Acute pharyngitis, unspecified: Secondary | ICD-10-CM | POA: Diagnosis not present

## 2019-04-22 NOTE — ED Triage Notes (Signed)
Pt reports exposure to 2 people with COVID. Pt with headache, cough, spine pain, sore throat. Pt declines throat swab.

## 2019-04-22 NOTE — Discharge Instructions (Signed)
Rest, fluids, tylenol °Await test result °

## 2019-04-22 NOTE — ED Provider Notes (Signed)
[facility]    CSN: [id number] Arrival date & time: 04/22/19  7628      History   Chief Complaint Chief Complaint  Patient presents with  . Cough    HPI Jimmi Melina Schools Pfalzgraf is a 28 y.o. female.   28 yo female with a c/o cough, headache and sore throat for the past 3-4 days. States exposure to 2 people with covid last week. Denies any fevers, chills or shortness of breath. Declines throat swab.      Past Medical History:  Diagnosis Date  . DVT (deep venous thrombosis) (HCC)   . Factor V Leiden (HCC)    heterozygous  . Gestational diabetes   . Heart murmur     Patient Active Problem List   Diagnosis Date Noted  . Obesity (BMI 30-39.9) 12/02/2017  . Labor and delivery, indication for care 12/01/2017  . DVT (deep venous thrombosis) (HCC)   . Factor V Leiden (HCC) 10/07/2016  . Acute deep vein thrombosis (DVT) of popliteal vein of left lower extremity (HCC) 08/17/2016    History reviewed. No pertinent surgical history.  OB History    Gravida  1   Para  1   Term  1   Preterm  0   AB  0   Living  1     SAB  0   TAB  0   Ectopic  0   Multiple  0   Live Births  1            Home Medications    Prior to Admission medications   Medication Sig Start Date End Date Taking? Authorizing Provider  acetaminophen (TYLENOL) 650 MG CR tablet Take 650 mg by mouth every 8 (eight) hours as needed.    [provider]  enoxaparin (LOVENOX) 60 MG/0.6ML injection Inject 60 mg into the skin daily. 06/23/17   [provider]  ibuprofen (ADVIL,MOTRIN) 600 MG tablet Take 1 tablet (600 mg total) by mouth every 6 (six) hours as needed. 12/03/17   Ward, Elenora Fender, MD  PRENATAL 28-0.8 MG TABS Take 1 tablet by mouth daily.    [provider]    Family History Family History  Problem Relation Age of Onset  . Cancer Maternal Aunt   . Hypertension Mother   . Alcohol abuse Father   . Diabetes Father        meds from liver  transplant  . Pulmonary embolism Maternal Grandmother   . Miscarriages / Stillbirths Paternal Grandmother     Social History Social History   Tobacco Use  . Smoking status: Never Smoker  . Smokeless tobacco: Never Used  Substance Use Topics  . Alcohol use: No  . Drug use: No     Allergies   Cat hair extract   Review of Systems Review of Systems   Physical Exam Triage Vital Signs ED Triage Vitals  Enc Vitals Group     BP 04/22/19 0833 112/84     Pulse Rate 04/22/19 0833 88     Resp 04/22/19 0833 18     Temp 04/22/19 0833 98 F (36.7 C)     Temp Source 04/22/19 0833 Oral     SpO2 04/22/19 0833 100 %     Weight 04/22/19 0835 200 lb (90.7 kg)     Height 04/22/19 0835 5' (1.524 m)     Head Circumference --      Peak Flow --      Pain Score 04/22/19 0835 1  Pain Loc --      Pain Edu? --      Excl. in [place]? --    No data found.  Updated Vital Signs BP 112/84 (BP Location: Right Arm)   Pulse 88   Temp 98 F (36.7 C) (Oral)   Resp 18   Ht 5' (1.524 m)   Wt 90.7 kg   LMP 04/09/2019   SpO2 100%   BMI 39.06 kg/m   Visual Acuity Right Eye Distance:   Left Eye Distance:   Bilateral Distance:    Right Eye Near:   Left Eye Near:    Bilateral Near:     Physical Exam Vitals signs and nursing note reviewed.  Constitutional:      General: She is not in acute distress.    Appearance: She is not toxic-appearing or diaphoretic.  Cardiovascular:     Rate and Rhythm: Normal rate.  Pulmonary:     Effort: Pulmonary effort is normal. No respiratory distress.  Neurological:     Mental Status: She is alert.      UC Treatments / Results  Labs (all labs ordered are listed, but only abnormal results are displayed) Labs Reviewed  NOVEL CORONAVIRUS, NAA (HOSP ORDER, SEND-OUT TO REF LAB; TAT 18-24 HRS)    EKG   Radiology No results found.  Procedures Procedures (including critical care time)  Medications Ordered in UC Medications - No data to display   Initial Impression / Assessment and Plan / UC Course  I have reviewed the triage vital signs and the nursing notes.  Pertinent labs & imaging results that were available during my care of the patient were reviewed by me and considered in my medical decision making (see chart for details).      Final Clinical Impressions(s) / UC Diagnoses   Final diagnoses:  Viral syndrome     Discharge Instructions     Rest, fluids, tylenol Await test result    ED Prescriptions    None     1. diagnosis reviewed with patient 2. Recommend supportive treatment as above 3. covid test done 4. Follow-up prn if symptoms worsen or don't improve  PDMP not reviewed this encounter.   Norval Gable, MD 04/22/19 1131

## 2019-04-23 LAB — NOVEL CORONAVIRUS, NAA (HOSP ORDER, SEND-OUT TO REF LAB; TAT 18-24 HRS): SARS-CoV-2, NAA: NOT DETECTED

## 2019-04-24 ENCOUNTER — Other Ambulatory Visit: Payer: Self-pay

## 2019-04-24 DIAGNOSIS — Z20822 Contact with and (suspected) exposure to covid-19: Secondary | ICD-10-CM

## 2019-04-26 LAB — NOVEL CORONAVIRUS, NAA: SARS-CoV-2, NAA: NOT DETECTED

## 2019-08-12 ENCOUNTER — Ambulatory Visit: Payer: Commercial Managed Care - PPO | Attending: Hematology and Oncology | Admitting: Physical Therapy

## 2019-08-12 ENCOUNTER — Encounter: Payer: Self-pay | Admitting: Physical Therapy

## 2019-08-12 DIAGNOSIS — R350 Frequency of micturition: Secondary | ICD-10-CM | POA: Insufficient documentation

## 2019-08-12 DIAGNOSIS — M6208 Separation of muscle (nontraumatic), other site: Secondary | ICD-10-CM | POA: Insufficient documentation

## 2019-08-12 DIAGNOSIS — N393 Stress incontinence (female) (male): Secondary | ICD-10-CM | POA: Insufficient documentation

## 2019-08-12 NOTE — Therapy (Signed)
[facility] [address] [place], [place], [address] Phone: [phone]   Fax:  [phone]  Patient Details  Name: Hannah Patterson MRN: [id number] Date of Birth: Mar 22, 1991 Referring Provider:  Medicine, Derrill Kay*  Encounter Date: 08/12/2019  PT/OT/SLP Screening Form   Time: in: 1600  Time out: 1635   Complaint: Patient notes that she has concerns for separation of abdominals and some concerns for frequent urination. Patient notes some occasional/infrequent SUI with full bladder. Patient notes she is able to hold for prolonged periods (such as workday), but when at home empties frequently and has some tendencies toward "just in case" emptying. Denies any concerns for pain.  Past Medical Hx:  OB/GYN: G1P1, Vaginally, minimal tear  Assessment: OBJECTIVE  Mental Status Patient is oriented to person, place and time.  Recent memory is intact.  Remote memory is intact.  Attention span and concentration are intact.  Expressive speech is intact.  Patient's fund of knowledge is within normal limits for educational level.  POSTURE/OBSERVATIONS:  Lumbar lordosis: WNL Iliac crest height: equal bilaterally Lumbar lateral shift: negative Pelvic obliquity: negative Leg length discrepancy: negative  GAIT: Trendelenburg R: Negative L: Negative  RANGE OF MOTION:    LEFT RIGHT  Lumbar forward flexion (65):  WNL    Lumbar extension (30): WNL    Lumbar lateral flexion (25):  WNL WNL  Thoracic and Lumbar rotation (30 degrees):    WNL WNL  Hip Flexion (0-125):   WNL WNL  Hip IR (0-45):  WNL WNL  Hip ER (0-45):  WNL WNL  Hip Abduction (0-40):  WNL WNL  Hip extension (0-15):  WNL WNL    ABDOMINAL:  Palpation: no TTP Diastasis: 1.5 finger width separation at umbilicus, superiorly and inferiorly WNL < 1 finger width Rib flare: negative   EXTERNAL PELVIC EXAM: Patient educated on the purpose of the exam and articulated  understanding; patient consented to the exam verbally. Palpation: no TTP Breath coordination: well coordinated Cued Lengthen: able to perform quickly and with palpable length Cued Contraction: able to perform quickly and with strong lift (hold. 3 sec) Cough: coordinated; no indication for paradoxical lengthen  EDUCATION Patient educated on primary functions of the pelvic floor including: posture/balance, sexual pleasure, storage and elimination of waste from the body, abdominal cavity closure, and breath coordination. Patient provided with bladder diary and urge suppression handout to support self-management/behavioral changes.     Recommendations:   Patient safe to apply behavior changes discussed during screening.  Comments: Patient does not present with deficits that warrant PT evaluation/treatment.    []  Patient would benefit from an MD referral []  Patient would benefit from a full PT/OT/ SLP evaluation and treatment. [x]  No intervention recommended at this time.   PT, DPT [phone] 08/12/2019, 4:03 PM  [place] [facility] [address] [place], [facility], [address] Phone: [phone]   Fax:  [phone]

## 2019-10-14 IMAGING — US US OB LIMITED
1 series · 14 of 19 positions shown · non-contrast
Comparison: none

CLINICAL DATA: 26-year-old pregnant female presents with 1 day of
spotting.

EDC by LMP: 12/08/2017, projecting to an expected gestational age of
18 weeks 3 days.
EXAM:
LIMITED OBSTETRIC ULTRASOUND

[Series 1: us ob limited · 0.22mm/px · 14 of 19 slices shown]
[im 1/19]
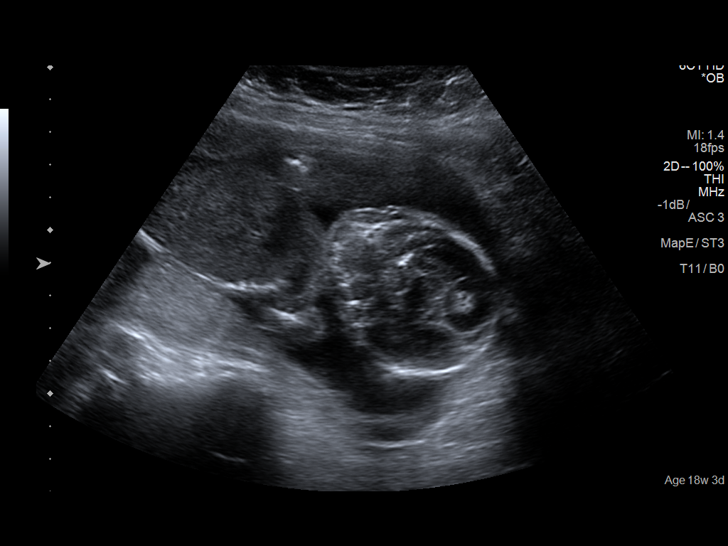
[im 3/19]
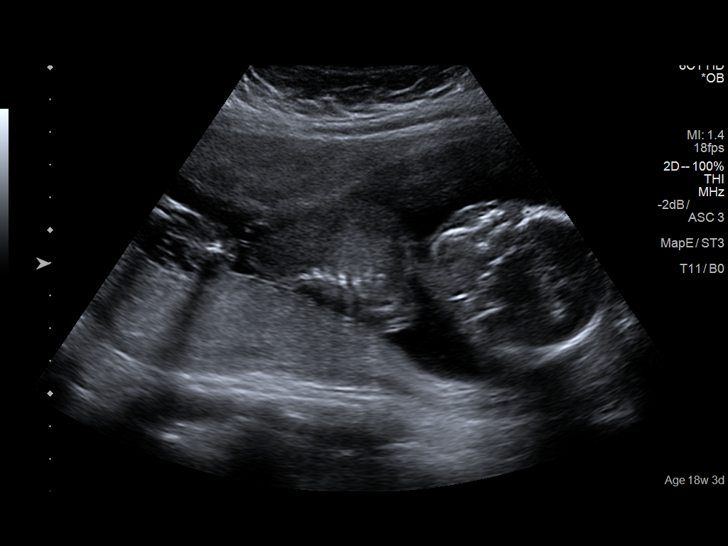
[im 4/19]
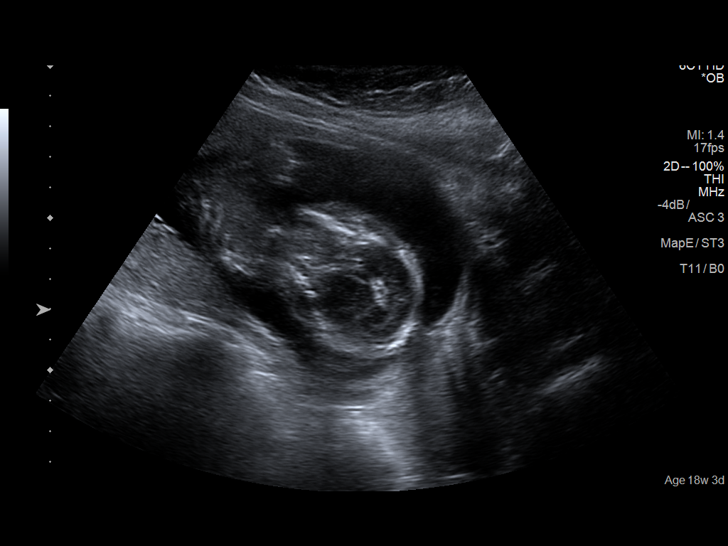
[im 5/19]
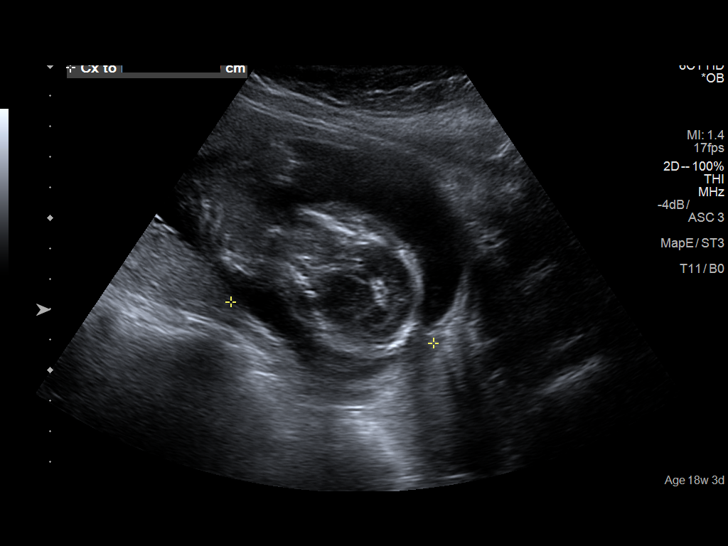
[im 7/19]
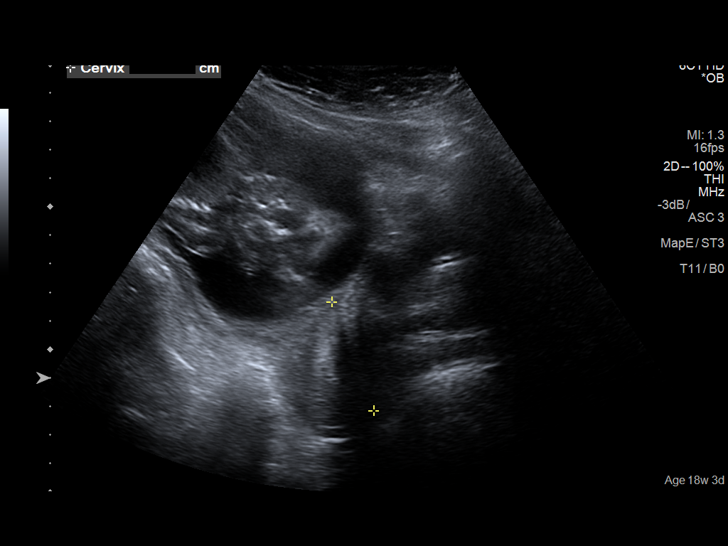
[im 8/19]
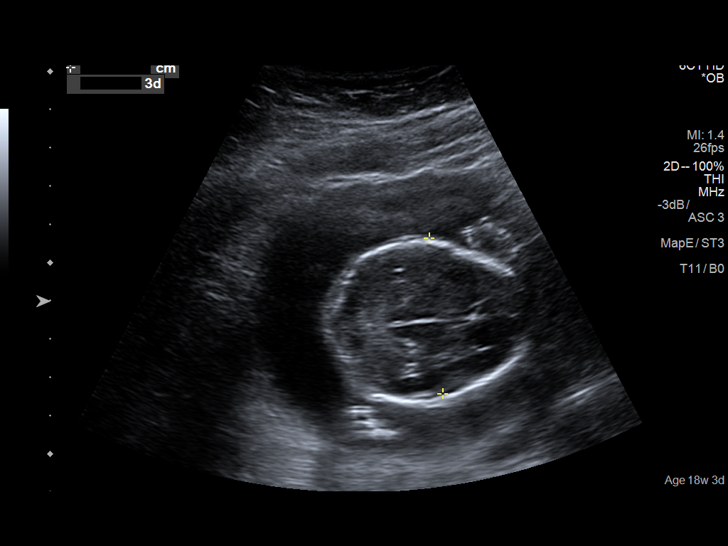
[im 9/19]
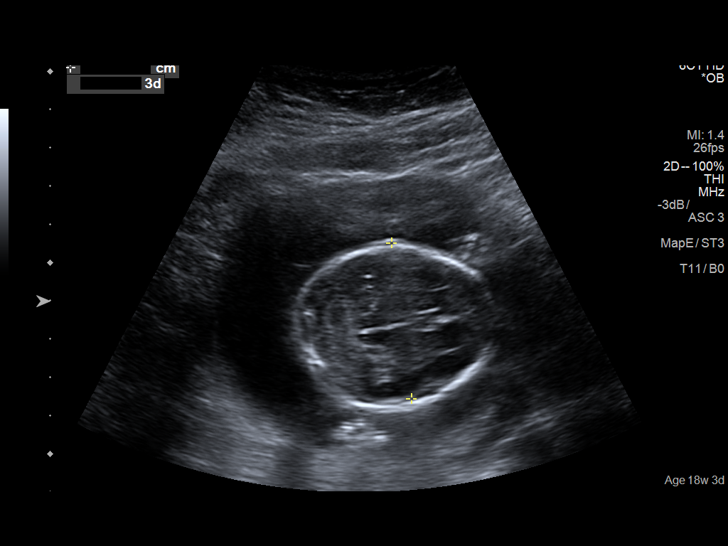
[im 11/19]
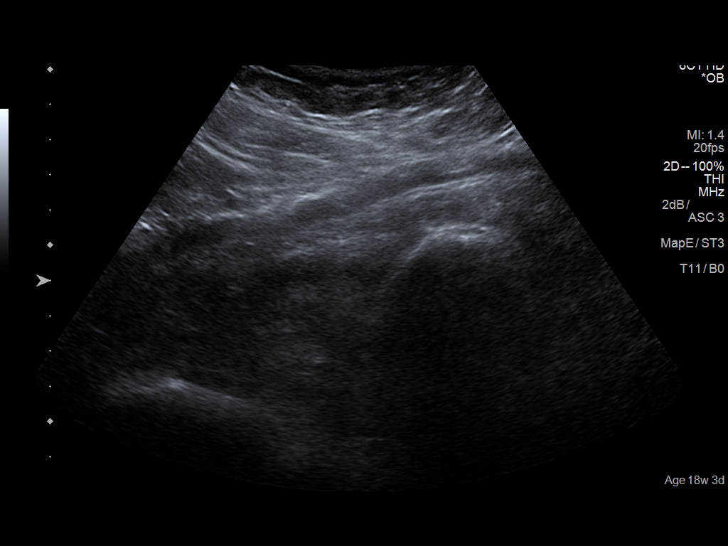
[im 12/19]
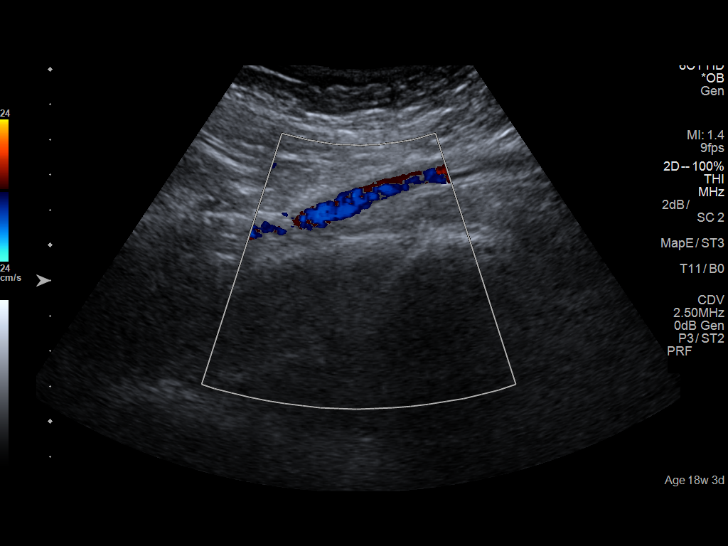
[im 13/19]
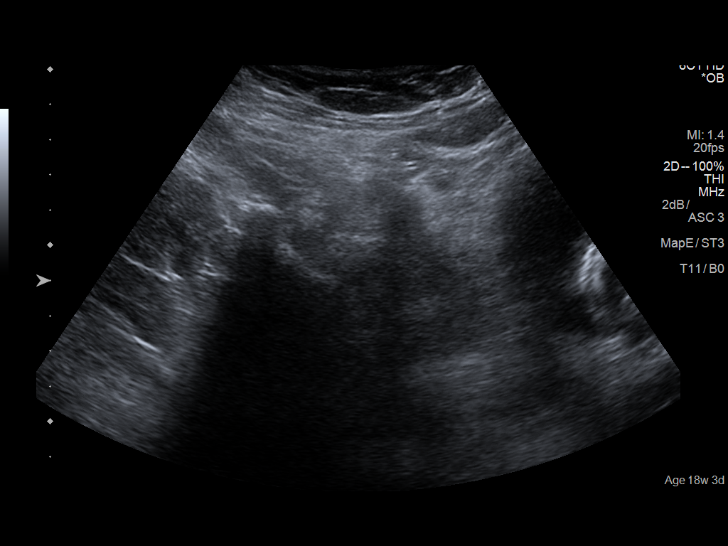
[im 15/19]
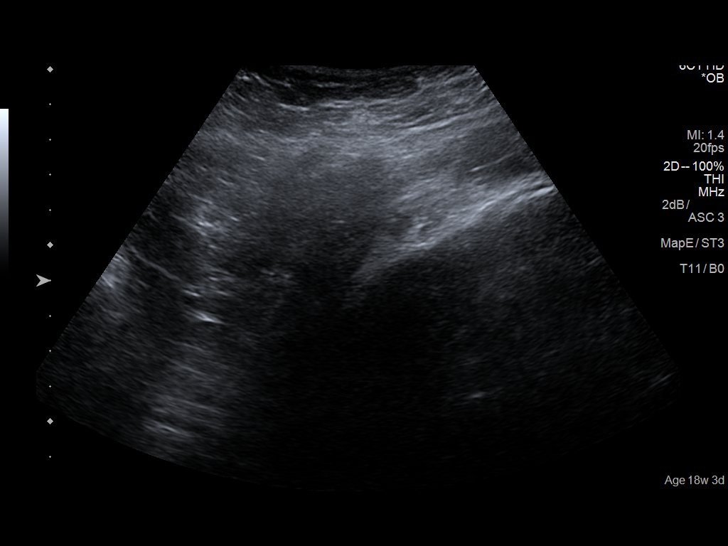
[im 16/19]
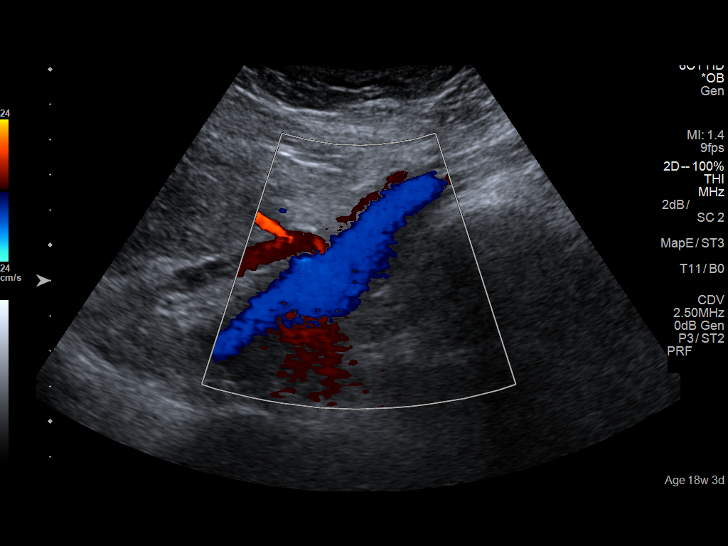
[im 17/19]
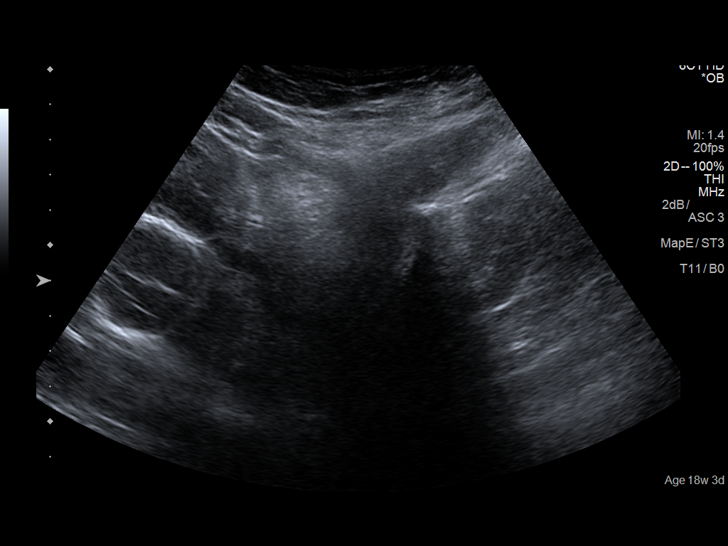
[im 19/19]
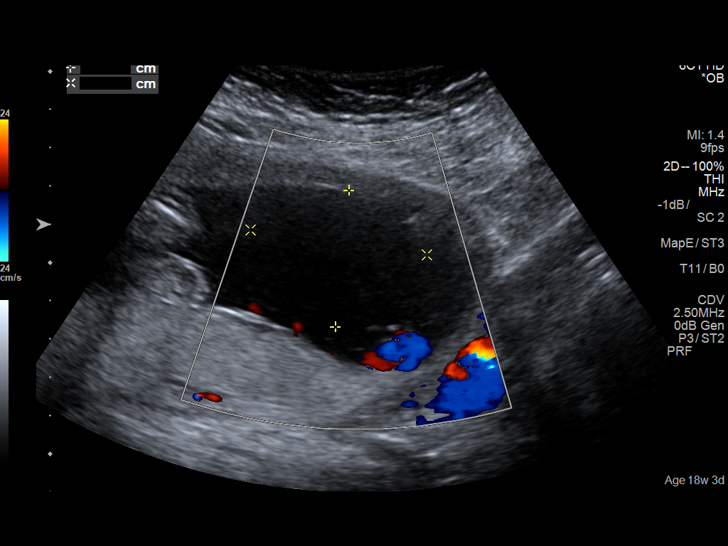

[14 of 19 positions shown; findings below may reference images not displayed]

FINDINGS: Number of Fetuses: 1

Heart Rate:  150 bpm

Movement: Yes

Presentation: Cephalic

Placental Location: Posterior

Previa: No

Amniotic Fluid (Subjective): Within normal limits. Deepest vertical
fluid pocket 3.6 cm.

BPD:  4.1cm 18w 3d

MATERNAL FINDINGS:

Cervix: Appears closed. Cervix length approximately 4.1 cm on these
transabdominal images.

Uterus/Adnexae: No abnormality visualized.
IMPRESSION: 1. Single living intrauterine gestation in cephalic lie at 18 weeks
3 days by limited fetal biometry, concordant with provided menstrual
dating.
2. No acute gestational abnormality demonstrated. Normal fetal
cardiac activity. Normal amniotic fluid volume. Cervix appears
normal in length with no evidence of cervical funneling. No placenta
previa.

This exam is performed on an emergent basis and does not
comprehensively evaluate fetal size, dating, or anatomy; follow-up
complete OB US should be considered if further fetal assessment is
warranted.

## 2019-12-19 LAB — OB RESULTS CONSOLE RPR: RPR: NONREACTIVE

## 2019-12-19 LAB — OB RESULTS CONSOLE HEPATITIS B SURFACE ANTIGEN: Hepatitis B Surface Ag: NEGATIVE

## 2019-12-19 LAB — OB RESULTS CONSOLE HIV ANTIBODY (ROUTINE TESTING): HIV: NONREACTIVE

## 2019-12-19 LAB — OB RESULTS CONSOLE VARICELLA ZOSTER ANTIBODY, IGG: Varicella: IMMUNE

## 2019-12-19 LAB — OB RESULTS CONSOLE RUBELLA ANTIBODY, IGM: Rubella: IMMUNE

## 2020-05-15 ENCOUNTER — Observation Stay
Admission: AD | Admit: 2020-05-15 | Discharge: 2020-05-16 | Disposition: A | Discharge status: Home / Self Care | Payer: Commercial Managed Care - PPO | Source: Ambulatory Visit | Attending: Obstetrics and Gynecology | Admitting: Obstetrics and Gynecology

## 2020-05-15 ENCOUNTER — Other Ambulatory Visit: Payer: Self-pay

## 2020-05-15 ENCOUNTER — Inpatient Hospital Stay: Payer: Commercial Managed Care - PPO

## 2020-05-15 DIAGNOSIS — Z3A33 33 weeks gestation of pregnancy: Secondary | ICD-10-CM | POA: Diagnosis not present

## 2020-05-15 DIAGNOSIS — M79662 Pain in left lower leg: Secondary | ICD-10-CM | POA: Diagnosis not present

## 2020-05-15 DIAGNOSIS — M79606 Pain in leg, unspecified: Secondary | ICD-10-CM

## 2020-05-15 DIAGNOSIS — O479 False labor, unspecified: Secondary | ICD-10-CM | POA: Diagnosis present

## 2020-05-15 DIAGNOSIS — O26893 Other specified pregnancy related conditions, third trimester: Secondary | ICD-10-CM | POA: Diagnosis present

## 2020-05-15 LAB — CREATININE, SERUM
Creatinine, Ser: 0.45 mg/dL (ref 0.44–1.00)
GFR, Estimated: >60 mL/min (ref >60)

## 2020-05-15 LAB — WET PREP, GENITAL
Clue Cells Wet Prep HPF POC: NONE SEEN
Sperm: NONE SEEN
Trich, Wet Prep: NONE SEEN
Yeast Wet Prep HPF POC: NONE SEEN

## 2020-05-15 MED ORDER — ACETAMINOPHEN 325 MG PO TABS: 650.0000 mg | ORAL_TABLET | ORAL | Status: DC | PRN

## 2020-05-15 MED ORDER — LACTATED RINGERS IV SOLN: INTRAVENOUS | Status: DC

## 2020-05-15 MED ORDER — LACTATED RINGERS IV BOLUS
500.0000 mL | Freq: Once | INTRAVENOUS | Status: AC
  Administered 2020-05-15: 500 mL via INTRAVENOUS

## 2020-05-15 MED ORDER — ENOXAPARIN SODIUM 60 MG/0.6ML ~~LOC~~ SOLN
60.0000 mg | SUBCUTANEOUS | Status: DC
  Administered 2020-05-16: 01:00:00 60 mg via SUBCUTANEOUS
  Filled 2020-05-15: qty 0.6

## 2020-05-15 MED ORDER — TERBUTALINE SULFATE 1 MG/ML IJ SOLN
0.2500 mg | Freq: Once | INTRAMUSCULAR | Status: AC
  Administered 2020-05-15: 23:00:00 0.25 mg via SUBCUTANEOUS
  Filled 2020-05-15: qty 1

## 2020-05-15 MED ORDER — PRENATAL MULTIVITAMIN CH
1.0000 | ORAL_TABLET | Freq: Every day | ORAL | Status: DC
  Administered 2020-05-15: 23:00:00 1 via ORAL
  Filled 2020-05-15: qty 1

## 2020-05-15 NOTE — Progress Notes (Deleted)
Patient ID: Hannah Patterson MRN: [id number] DOB/AGE: September 19, 1990 29 y.o.  Admit date: 05/15/2020 Discharge date: 05/16/2020  Admission Diagnoses: Left calf pain with a h/o DVT, Uterine Contractions  Factors complicating this pregnancy  1. BMI 40 2. Early GDMA1 this pregnancy at 12wks   Early GTT: 135; pt opts to be dx GDMA1 rather than complete 3hr GTT  Discharge Diagnoses: Negative DVT assessment, Uterine contractions   Prenatal Procedures: ultrasound  Consults: none  Significant Diagnostic Studies:  Results for orders placed or performed during the hospital encounter of 05/15/20 (from the past 168 hour(s))  Wet prep, genital   Collection Time: 05/15/20  7:55 PM   Specimen: Vaginal  Result Value Ref Range   Yeast Wet Prep HPF POC NONE SEEN NONE SEEN   Trich, Wet Prep NONE SEEN NONE SEEN   Clue Cells Wet Prep HPF POC NONE SEEN NONE SEEN   WBC, Wet Prep HPF POC MODERATE (A) NONE SEEN   Sperm NONE SEEN   Creatinine, serum   Collection Time: 05/15/20 10:49 PM  Result Value Ref Range   Creatinine, Ser 0.45 0.44 - 1.00 mg/dL   GFR, Estimated >24 >26 mL/min    Treatments: IV hydration  Hospital Course:  This is a 29 y.o. G2P1001 with IUP at [redacted]w[redacted]d admitted for left calf pain.  She has a h/o DVT and confirmed Factor V Leiden mutation, on ppx Lovenox 60mg  QD.    Last u/s 04/01/20: Efw=2lb5oz(1056g)=52%, Afi=17.28cm @ 65% Fhr=136bpm Placenta=posterior Position=vertex  C/w Dr. 13/3/21 - Doppler study  Doppler study neg   UC began after returning from u/s. Recent IC last night around 2000 - unable to collect an fFN.  Wet prep collected - neg. IVF started with 2001 bolus.  After IVF pt reports she is still feeling UCs, will observe through the night and give a dose of Terbutaline.  Will also order her nightly dose of Lovenox and Prenatal vitamin.  At 2220 one variable noted - will monitor FHT through the night.  UCs are reported less intense but still present. No SVE  change.  Will d/c home with in depth precautions.  She was deemed stable for discharge to home with outpatient follow up.  Discharge Physical Exam:  BP 120/72    Pulse 79    Temp 97.6 F (36.4 C) (Oral)    Resp 16    Ht 5' (1.524 m)    Wt 92.5 kg    BMI 39.84 kg/m   General: NAD CV: RRR Pulm: CTABL, nl effort ABD: s/nd/nt, gravid DVT Evaluation: LE non-ttp, no evidence of DVT on exam.  NST: FHR baseline: 135 bpm Variability: moderate Accelerations: yes Decelerations: none Time: 20 minutes Category/reactivity: reactive  TOCO: quiet  Dilation: Fingertip Effacement (%): 20 Cervical Position: Anterior Station: -3 Exam by:: Rue Valladares cnm   Discharge Condition: Stable  Disposition: Discharge disposition: 01-Home or Self Care        Allergies as of 05/16/2020      Reactions   Cat Hair Extract Swelling      Medication List    TAKE these medications   acetaminophen 650 MG CR tablet Commonly known as: TYLENOL Take 650 mg by mouth every 8 (eight) hours as needed.   aspirin 81 MG chewable tablet Chew 81 mg by mouth daily.   enoxaparin 60 MG/0.6ML injection Commonly known as: LOVENOX Inject 60 mg into the skin daily.   ibuprofen 600 MG tablet Commonly known as: ADVIL Take 1 tablet (600 mg total) by mouth  every 6 (six) hours as needed.   Prenatal 28-0.8 MG Tabs Take 1 tablet by mouth daily.       Follow-up Information    [facility]. Call.   Why: to see if you can get an earlier appt then your scheduled appt on Thurs. Contact information: [address] [place] [address] [phone]              Signed:  Quillian Quince 05/16/2020 7:35 AM

## 2020-05-15 NOTE — OB Triage Note (Signed)
Pt c/o pain in left leg since Thursday morning at 5;45. Pt has a history of DVTs last 06/2016, states that this feels very similar. No heat, no redness, mildly swollen and positive Homans sign. States this feels similar to her last DVT. Pt also reports braxton hicks but reports them as no more painful or frequent than shes been experiencing. Pt reports good fetal movement. Denies vaginal bleeding or LOF.

## 2020-05-16 NOTE — Discharge Instructions (Signed)
Preventing Preterm Birth Preterm birth is when your baby is delivered between 20 weeks and 37 weeks of pregnancy. A full-term pregnancy lasts for at least 37 weeks. Preterm birth can be dangerous for your baby because the last few weeks of pregnancy are an important time for your baby's brain and lungs to grow. Many things can cause a baby to be born early. Sometimes the cause is not known. There are certain factors that make you more likely to experience preterm birth, such as:  Having a previous baby born preterm.  Being pregnant with twins or other multiples.  Having had fertility treatment.  Being overweight or underweight at the start of your pregnancy.  Having any of the following during pregnancy: ? An infection, including a urinary tract infection (UTI) or an STI (sexually transmitted infection). ? High blood pressure. ? Diabetes. ? Vaginal bleeding.  Being age 35 or older.  Being age 18 or younger.  Getting pregnant within 6 months of a previous pregnancy.  Suffering extreme stress or physical or emotional abuse during pregnancy.  Standing for long periods of time during pregnancy, such as working at a job that requires standing. What are the risks? The most serious risk of preterm birth is that the baby may not survive. This is more likely to happen if a baby is born before 34 weeks. Other risks and complications of preterm birth may include your baby having:  Breathing problems.  Brain damage that affects movement and coordination (cerebral palsy).  Feeding difficulties.  Vision or hearing problems.  Infections or inflammation of the digestive tract (colitis).  Developmental delays.  Learning disabilities.  Higher risk for diabetes, heart disease, and high blood pressure later in life. What can I do to lower my risk?  Medical care The most important thing you can do to lower your risk for preterm birth is to get routine medical care during pregnancy (prenatal  care). If you have a high risk of preterm birth, you may be referred to a health care provider who specializes in managing high-risk pregnancies (perinatologist). You may be given medicine to help prevent preterm birth. Lifestyle changes Certain lifestyle changes can also lower your risk of preterm birth:  Wait at least 6 months after a pregnancy to become pregnant again.  Try to plan pregnancy for when you are between 19 and 35 years old.  Get to a healthy weight before getting pregnant. If you are overweight, work with your health care provider to safely lose weight.  Do not use any products that contain nicotine or tobacco, such as cigarettes and e-cigarettes. If you need help quitting, ask your health care provider.  Do not drink alcohol.  Do not use drugs. Where to find support For more support, consider:  Talking with your health care provider.  Talking with a therapist or substance abuse counselor, if you need help quitting.  Working with a diet and nutrition specialist (dietitian) or a personal trainer to maintain a healthy weight.  Joining a support group. Where to find more information Learn more about preventing preterm birth from:  Centers for Disease Control and Prevention: cdc.gov/reproductivehealth/maternalinfanthealth/pretermbirth.htm  March of Dimes: marchofdimes.org/complications/premature-babies.aspx  American Pregnancy Association: americanpregnancy.org/labor-and-birth/premature-labor Contact a health care provider if:  You have any of the following signs of preterm labor before 37 weeks: ? A change or increase in vaginal discharge. ? Fluid leaking from your vagina. ? Pressure or cramps in your lower abdomen. ? A backache that does not go away or gets worse. ?   Regular tightening (contractions) in your lower abdomen. Summary  Preterm birth means having your baby during weeks 20-37 of pregnancy.  Preterm birth may put your baby at risk for physical and  mental problems.  Getting good prenatal care can help prevent preterm birth.  You can lower your risk of preterm birth by making certain lifestyle changes, such as not smoking and not using alcohol. This information is not intended to replace advice given to you by your health care provider. Make sure you discuss any questions you have with your health care provider. Document Revised: 04/28/2017 Document Reviewed: 01/23/2016 Elsevier Patient Education  2020 Elsevier Inc.  

## 2020-05-28 NOTE — Discharge Summary (Signed)
Patient ID: °Hannah Patterson °MRN: [id number] °DOB/AGE: 10/05/1990 29 y.o. ° °Admit date: 05/15/2020 °Discharge date: 05/16/2020 ° °Admission Diagnoses: Left calf pain with a h/o DVT, Uterine Contractions ° °Factors complicating this pregnancy  °1. BMI 40 °2. Early GDMA1 this pregnancy at 12wks ° · Early GTT: 135; pt opts to be dx GDMA1 rather than complete 3hr GTT ° °Discharge Diagnoses: Negative DVT assessment, Uterine contractions  ° °Prenatal Procedures: ultrasound ° °Consults: none ° °Significant Diagnostic Studies:  °Results for orders placed or performed during the hospital encounter of 05/15/20 (from the past 168 hour(s))  °Wet prep, genital  ° Collection Time: 05/15/20  7:55 PM  ° Specimen: Vaginal  °Result Value Ref Range  ° Yeast Wet Prep HPF POC NONE SEEN NONE SEEN  ° Trich, Wet Prep NONE SEEN NONE SEEN  ° Clue Cells Wet Prep HPF POC NONE SEEN NONE SEEN  ° WBC, Wet Prep HPF POC MODERATE (A) NONE SEEN  ° Sperm NONE SEEN   °Creatinine, serum  ° Collection Time: 05/15/20 10:49 PM  °Result Value Ref Range  ° Creatinine, Ser 0.45 0.44 - 1.00 mg/dL  ° GFR, Estimated >60 >60 mL/min  ° ° °Treatments: IV hydration ° °Hospital Course:  °This is a 29 y.o. G2P1001 with IUP at [redacted]w[redacted]d admitted for left calf pain.  She has a h/o DVT and confirmed Factor V Leiden mutation, on ppx Lovenox 60mg QD.   ° °Last u/s 04/01/20: Efw=2lb5oz(1056g)=52%, Afi=17.28cm @ 65% Fhr=136bpm Placenta=posterior Position=vertex ° °C/w Dr. Schermerhorn - Doppler study ° °Doppler study neg  ° °UC began after returning from u/s. Recent IC last night around 2000 - unable to collect an fFN.  Wet prep collected - neg. IVF started with 500mL bolus. ° °After IVF pt reports she is still feeling UCs, will observe through the night and give a dose of Terbutaline.  Will also order her nightly dose of Lovenox and Prenatal vitamin.  At 2220 one variable noted - will monitor FHT through the night. ° °UCs are reported less intense but still present. No SVE  change.  Will d/c home with in depth precautions. ° °She was deemed stable for discharge to home with outpatient follow up. ° °Discharge Physical Exam:  °BP 120/72    Pulse 79    Temp 97.6 °F (36.4 °C) (Oral)    Resp 16    Ht 5' (1.524 m)    Wt 92.5 kg    BMI 39.84 kg/m²  ° °General: NAD °CV: RRR °Pulm: CTABL, nl effort °ABD: s/nd/nt, gravid °DVT Evaluation: LE non-ttp, no evidence of DVT on exam. ° °NST: °FHR baseline: 135 bpm °Variability: moderate °Accelerations: yes °Decelerations: none °Time: 20 minutes °Category/reactivity: reactive ° °TOCO: quiet ° °Dilation: Fingertip °Effacement (%): 20 °Cervical Position: Anterior °Station: -3 °Exam by:: Ife Vitelli cnm ° ° °Discharge Condition: Stable ° °Disposition: Discharge disposition: 01-Home or Self Care ° ° ° ° ° ° ° °Allergies as of 05/16/2020   °   Reactions  ° Cat Hair Extract Swelling  °  °  °Medication List  °  °TAKE these medications   °acetaminophen 650 MG CR tablet °Commonly known as: TYLENOL °Take 650 mg by mouth every 8 (eight) hours as needed. °  °aspirin 81 MG chewable tablet °Chew 81 mg by mouth daily. °  °enoxaparin 60 MG/0.6ML injection °Commonly known as: LOVENOX °Inject 60 mg into the skin daily. °  °ibuprofen 600 MG tablet °Commonly known as: ADVIL °Take 1 tablet (600 mg total) by mouth   every 6 (six) hours as needed. °  °Prenatal 28-0.8 MG Tabs °Take 1 tablet by mouth daily. °  °  ° ° Follow-up Information   ° [facility]. Call.   °Why: to see if you can get an earlier appt then your scheduled appt on Thurs. °Contact information: °[address] °[place] [address] °[phone] ° °  °  °  °  °  ° ° °Signed: ° °Edric Fetterman, CNM °05/16/2020 7:35 AM ° ° °

## 2020-05-30 NOTE — L&D Delivery Note (Signed)
.  Delivery Note  First Stage: Labor onset: 1417 INduction: Cook cath, Pitocin and AROM Analgesia/Anesthesia intrapartum: epidural AROM at 1417  Second Stage: Complete dilation at 1645 Onset of pushing at 1655 FHR second stage cat II, early and variables, baseline to 115bpm last .   Delivery of a viable female infant on 06/27/20 at 1701 by Heloise Ochoa CNM delivery of fetal head in OA position with restitution to ROA. no nuchal cord;  Anterior then posterior shoulders delivered easily with gentle downward traction. Baby placed on mom's chest, and attended to by peds.  Cord double clamped after cessation of pulsation, cut by FOB  Third Stage: Placenta delivered spontaneously intact with 3VC @ 1704 Placenta disposition: routine disposal Uterine tone Firm / bleeding scant  No lacerations identified, intact cervix, vagina, perineum. Anesthesia for repair: epidural Repair n/a Est. Blood Loss (mL): 100  Complications: none  Mom to postpartum.  Baby to Couplet care / Skin to Skin.  Newborn: Birth Weight: pending  Apgar Scores: 8/9 Feeding planned: breast

## 2020-06-09 LAB — OB RESULTS CONSOLE GBS: GBS: POSITIVE

## 2020-06-09 LAB — OB RESULTS CONSOLE GC/CHLAMYDIA
Chlamydia: NEGATIVE
Gonorrhea: NEGATIVE

## 2020-06-27 ENCOUNTER — Other Ambulatory Visit: Payer: Self-pay

## 2020-06-27 ENCOUNTER — Encounter: Payer: Self-pay | Admitting: Obstetrics & Gynecology

## 2020-06-27 ENCOUNTER — Inpatient Hospital Stay: Payer: BC Managed Care – PPO | Admitting: Anesthesiology

## 2020-06-27 ENCOUNTER — Inpatient Hospital Stay
Admission: EM | Admit: 2020-06-27 | Discharge: 2020-06-28 | DRG: 806 | Disposition: A | Discharge status: Home / Self Care | Payer: BC Managed Care – PPO | Attending: Obstetrics & Gynecology | Admitting: Obstetrics & Gynecology

## 2020-06-27 DIAGNOSIS — Z7901 Long term (current) use of anticoagulants: Secondary | ICD-10-CM

## 2020-06-27 DIAGNOSIS — Z20822 Contact with and (suspected) exposure to covid-19: Secondary | ICD-10-CM | POA: Diagnosis present

## 2020-06-27 DIAGNOSIS — D6851 Activated protein C resistance: Secondary | ICD-10-CM | POA: Diagnosis present

## 2020-06-27 DIAGNOSIS — Z86718 Personal history of other venous thrombosis and embolism: Secondary | ICD-10-CM | POA: Diagnosis not present

## 2020-06-27 DIAGNOSIS — O99824 Streptococcus B carrier state complicating childbirth: Secondary | ICD-10-CM | POA: Diagnosis present

## 2020-06-27 DIAGNOSIS — O99119 Other diseases of the blood and blood-forming organs and certain disorders involving the immune mechanism complicating pregnancy, unspecified trimester: Secondary | ICD-10-CM | POA: Diagnosis present

## 2020-06-27 DIAGNOSIS — O9912 Other diseases of the blood and blood-forming organs and certain disorders involving the immune mechanism complicating childbirth: Principal | ICD-10-CM | POA: Diagnosis present

## 2020-06-27 DIAGNOSIS — Z3A39 39 weeks gestation of pregnancy: Secondary | ICD-10-CM

## 2020-06-27 DIAGNOSIS — O24420 Gestational diabetes mellitus in childbirth, diet controlled: Secondary | ICD-10-CM | POA: Diagnosis present

## 2020-06-27 LAB — COMPREHENSIVE METABOLIC PANEL
ALT: 18 U/L (ref 0–44)
AST: 24 U/L (ref 15–41)
Albumin: 3 g/dL — ABNORMAL LOW (ref 3.5–5.0)
Alkaline Phosphatase: 197 U/L — ABNORMAL HIGH (ref 38–126)
Anion gap: 9 (ref 5–15)
BUN: 8 mg/dL (ref 6–20)
CO2: 21 mmol/L — ABNORMAL LOW (ref 22–32)
Calcium: 8.8 mg/dL — ABNORMAL LOW (ref 8.9–10.3)
Chloride: 106 mmol/L (ref 98–111)
Creatinine, Ser: 0.48 mg/dL (ref 0.44–1.00)
GFR, Estimated: >60 mL/min (ref >60)
Glucose, Bld: 80 mg/dL (ref 70–99)
Potassium: 3.5 mmol/L (ref 3.5–5.1)
Sodium: 136 mmol/L (ref 135–145)
Total Bilirubin: 0.4 mg/dL (ref 0.3–1.2)
Total Protein: 6.6 g/dL (ref 6.5–8.1)

## 2020-06-27 LAB — PROTEIN / CREATININE RATIO, URINE
Creatinine, Urine: 47 mg/dL
Protein Creatinine Ratio: 0.17 mg/mg{Cre} — ABNORMAL HIGH (ref 0.00–0.15)
Total Protein, Urine: 8 mg/dL

## 2020-06-27 LAB — SARS CORONAVIRUS 2 BY RT PCR (HOSPITAL ORDER, PERFORMED IN ~~LOC~~ HOSPITAL LAB): SARS Coronavirus 2: NEGATIVE

## 2020-06-27 LAB — GLUCOSE, CAPILLARY
Glucose-Capillary: 89 mg/dL (ref 70–99)
Glucose-Capillary: 76 mg/dL (ref 70–99)

## 2020-06-27 LAB — CBC
HCT: 34.6 % — ABNORMAL LOW (ref 36.0–46.0)
Hemoglobin: 11.9 g/dL — ABNORMAL LOW (ref 12.0–15.0)
MCH: 30.4 pg (ref 26.0–34.0)
MCHC: 34.4 g/dL (ref 30.0–36.0)
MCV: 88.5 fL (ref 80.0–100.0)
Platelets: 243 10*3/uL (ref 150–400)
RBC: 3.91 MIL/uL (ref 3.87–5.11)
RDW: 14.6 % (ref 11.5–15.5)
WBC: 10.7 10*3/uL — ABNORMAL HIGH (ref 4.0–10.5)
nRBC: 0 % (ref 0.0–0.2)

## 2020-06-27 LAB — TYPE AND SCREEN
ABO/RH(D): A POS
Antibody Screen: NEGATIVE

## 2020-06-27 MED ORDER — TERBUTALINE SULFATE 1 MG/ML IJ SOLN: 0.2500 mg | Freq: Once | INTRAMUSCULAR | Status: DC | PRN

## 2020-06-27 MED ORDER — FENTANYL CITRATE (PF) 100 MCG/2ML IJ SOLN
50.0000 ug | INTRAMUSCULAR | Status: DC | PRN
  Administered 2020-06-27: 100 ug via INTRAVENOUS
  Filled 2020-06-27: qty 2

## 2020-06-27 MED ORDER — SODIUM CHLORIDE 0.9 % IV SOLN
INTRAVENOUS | Status: AC
  Administered 2020-06-27: 5 10*6.[IU] via INTRAVENOUS
  Filled 2020-06-27: qty 5

## 2020-06-27 MED ORDER — MISOPROSTOL 200 MCG PO TABS
ORAL_TABLET | ORAL | Status: AC
  Filled 2020-06-27: qty 4

## 2020-06-27 MED ORDER — OXYCODONE HCL 5 MG PO TABS: 10.0000 mg | ORAL_TABLET | ORAL | Status: DC | PRN

## 2020-06-27 MED ORDER — PHENYLEPHRINE 40 MCG/ML (10ML) SYRINGE FOR IV PUSH (FOR BLOOD PRESSURE SUPPORT): 80.0000 ug | PREFILLED_SYRINGE | INTRAVENOUS | Status: DC | PRN

## 2020-06-27 MED ORDER — PENICILLIN G POT IN DEXTROSE 60000 UNIT/ML IV SOLN
3.0000 10*6.[IU] | INTRAVENOUS | Status: DC
  Administered 2020-06-27: 3 10*6.[IU] via INTRAVENOUS
  Filled 2020-06-27: qty 50

## 2020-06-27 MED ORDER — ACETAMINOPHEN 325 MG PO TABS
650.0000 mg | ORAL_TABLET | ORAL | Status: DC | PRN
  Administered 2020-06-28 (×4): 650 mg via ORAL
  Filled 2020-06-27 (×3): qty 2

## 2020-06-27 MED ORDER — FENTANYL 2.5 MCG/ML W/ROPIVACAINE 0.15% IN NS 100 ML EPIDURAL (ARMC)
12.0000 mL/h | EPIDURAL | Status: DC
  Administered 2020-06-27: 12 mL/h via EPIDURAL

## 2020-06-27 MED ORDER — ENOXAPARIN SODIUM 60 MG/0.6ML ~~LOC~~ SOLN
60.0000 mg | Freq: Every day | SUBCUTANEOUS | Status: DC
  Administered 2020-06-28: 60 mg via SUBCUTANEOUS
  Filled 2020-06-27: qty 0.6

## 2020-06-27 MED ORDER — ONDANSETRON HCL 4 MG PO TABS
4.0000 mg | ORAL_TABLET | ORAL | Status: DC | PRN
  Filled 2020-06-27: qty 1

## 2020-06-27 MED ORDER — IBUPROFEN 600 MG PO TABS
600.0000 mg | ORAL_TABLET | Freq: Four times a day (QID) | ORAL | Status: DC
  Administered 2020-06-28: 600 mg via ORAL
  Filled 2020-06-27 (×2): qty 1

## 2020-06-27 MED ORDER — ACETAMINOPHEN 325 MG PO TABS: 650.0000 mg | ORAL_TABLET | ORAL | Status: DC | PRN

## 2020-06-27 MED ORDER — LACTATED RINGERS IV SOLN
500.0000 mL | INTRAVENOUS | Status: DC | PRN
  Administered 2020-06-27: 500 mL via INTRAVENOUS

## 2020-06-27 MED ORDER — SIMETHICONE 80 MG PO CHEW: 80.0000 mg | CHEWABLE_TABLET | ORAL | Status: DC | PRN

## 2020-06-27 MED ORDER — LACTATED RINGERS IV SOLN
500.0000 mL | Freq: Once | INTRAVENOUS | Status: AC
  Administered 2020-06-27: 500 mL via INTRAVENOUS

## 2020-06-27 MED ORDER — COCONUT OIL OIL: 1.0000 "application " | TOPICAL_OIL | Status: DC | PRN

## 2020-06-27 MED ORDER — OXYCODONE HCL 5 MG PO TABS: 5.0000 mg | ORAL_TABLET | ORAL | Status: DC | PRN

## 2020-06-27 MED ORDER — SODIUM CHLORIDE 0.9 % IV SOLN
INTRAVENOUS | Status: DC | PRN
  Administered 2020-06-27 (×2): 5 mL via EPIDURAL

## 2020-06-27 MED ORDER — LIDOCAINE-EPINEPHRINE (PF) 1.5 %-1:200000 IJ SOLN
INTRAMUSCULAR | Status: DC | PRN
  Administered 2020-06-27: 3 mL via EPIDURAL

## 2020-06-27 MED ORDER — VALACYCLOVIR HCL 500 MG PO TABS
500.0000 mg | ORAL_TABLET | Freq: Two times a day (BID) | ORAL | Status: DC
  Administered 2020-06-28: 500 mg via ORAL
  Filled 2020-06-27 (×3): qty 1

## 2020-06-27 MED ORDER — ONDANSETRON HCL 4 MG/2ML IJ SOLN
4.0000 mg | Freq: Four times a day (QID) | INTRAMUSCULAR | Status: DC | PRN
  Administered 2020-06-27: 4 mg via INTRAVENOUS
  Filled 2020-06-27: qty 2

## 2020-06-27 MED ORDER — AMMONIA AROMATIC IN INHA
RESPIRATORY_TRACT | Status: AC
  Filled 2020-06-27: qty 10

## 2020-06-27 MED ORDER — EPHEDRINE 5 MG/ML INJ
10.0000 mg | INTRAVENOUS | Status: DC | PRN
  Administered 2020-06-27: 10 mg via INTRAVENOUS

## 2020-06-27 MED ORDER — FENTANYL 2.5 MCG/ML W/ROPIVACAINE 0.15% IN NS 100 ML EPIDURAL (ARMC)
EPIDURAL | Status: AC
  Filled 2020-06-27: qty 100

## 2020-06-27 MED ORDER — LIDOCAINE HCL (PF) 1 % IJ SOLN
INTRAMUSCULAR | Status: AC
  Filled 2020-06-27: qty 30

## 2020-06-27 MED ORDER — OXYTOCIN-SODIUM CHLORIDE 30-0.9 UT/500ML-% IV SOLN
1.0000 m[IU]/min | INTRAVENOUS | Status: DC
  Administered 2020-06-27: 2 m[IU]/min via INTRAVENOUS
  Filled 2020-06-27: qty 1000

## 2020-06-27 MED ORDER — SODIUM CHLORIDE 0.9 % IV SOLN: 5.0000 10*6.[IU] | Freq: Once | INTRAVENOUS | Status: AC

## 2020-06-27 MED ORDER — OXYTOCIN BOLUS FROM INFUSION
333.0000 mL | Freq: Once | INTRAVENOUS | Status: AC
  Administered 2020-06-27: 333 mL via INTRAVENOUS

## 2020-06-27 MED ORDER — ONDANSETRON HCL 4 MG/2ML IJ SOLN: 4.0000 mg | INTRAMUSCULAR | Status: DC | PRN

## 2020-06-27 MED ORDER — OXYTOCIN-SODIUM CHLORIDE 30-0.9 UT/500ML-% IV SOLN: 2.5000 [IU]/h | INTRAVENOUS | Status: DC

## 2020-06-27 MED ORDER — DIPHENHYDRAMINE HCL 50 MG/ML IJ SOLN
12.5000 mg | INTRAMUSCULAR | Status: DC | PRN
Start: 2020-06-27 — End: 2020-06-27

## 2020-06-27 MED ORDER — SENNOSIDES-DOCUSATE SODIUM 8.6-50 MG PO TABS
2.0000 | ORAL_TABLET | Freq: Every day | ORAL | Status: DC
  Administered 2020-06-28: 2 via ORAL
  Filled 2020-06-27: qty 2

## 2020-06-27 MED ORDER — LACTATED RINGERS IV SOLN: INTRAVENOUS | Status: DC

## 2020-06-27 MED ORDER — SOD CITRATE-CITRIC ACID 500-334 MG/5ML PO SOLN: 30.0000 mL | ORAL | Status: DC | PRN

## 2020-06-27 MED ORDER — EPHEDRINE 5 MG/ML INJ
10.0000 mg | INTRAVENOUS | Status: DC | PRN
  Filled 2020-06-27: qty 4

## 2020-06-27 MED ORDER — PRENATAL MULTIVITAMIN CH
1.0000 | ORAL_TABLET | Freq: Every day | ORAL | Status: DC
  Filled 2020-06-27: qty 1

## 2020-06-27 MED ORDER — LIDOCAINE HCL (PF) 1 % IJ SOLN
INTRAMUSCULAR | Status: DC | PRN
  Administered 2020-06-27: 1 mL via SUBCUTANEOUS

## 2020-06-27 MED ORDER — OXYTOCIN 10 UNIT/ML IJ SOLN
INTRAMUSCULAR | Status: AC
  Filled 2020-06-27: qty 2

## 2020-06-27 MED ORDER — DIPHENHYDRAMINE HCL 25 MG PO CAPS: 25.0000 mg | ORAL_CAPSULE | Freq: Four times a day (QID) | ORAL | Status: DC | PRN

## 2020-06-27 MED ORDER — LIDOCAINE HCL (PF) 1 % IJ SOLN: 30.0000 mL | INTRAMUSCULAR | Status: DC | PRN

## 2020-06-27 MED ORDER — BENZOCAINE-MENTHOL 20-0.5 % EX AERO: 1.0000 "application " | INHALATION_SPRAY | CUTANEOUS | Status: DC | PRN

## 2020-06-27 MED ORDER — DIBUCAINE (PERIANAL) 1 % EX OINT: 1.0000 "application " | TOPICAL_OINTMENT | CUTANEOUS | Status: DC | PRN

## 2020-06-27 MED ORDER — ZOLPIDEM TARTRATE 5 MG PO TABS: 5.0000 mg | ORAL_TABLET | Freq: Every evening | ORAL | Status: DC | PRN

## 2020-06-27 MED ORDER — WITCH HAZEL-GLYCERIN EX PADS: 1.0000 "application " | MEDICATED_PAD | CUTANEOUS | Status: DC | PRN

## 2020-06-27 NOTE — Progress Notes (Signed)
Labor Progress Note  Hannah Patterson is a 30 y.o. G2P1001 at [redacted]w[redacted]d by ultrasound admitted for induction of labor due to Factor V leiden with hx DVT and GDMA1.  Subjective: comfortable after epidural   Objective: BP 118/70 (BP Location: Right Arm)   Pulse 78   Temp 97.7 F (36.5 C) (Oral)   Resp 20   Ht 5' (1.524 m)   Wt 93 kg   LMP 09/19/2019   SpO2 99%   BMI 40.04 kg/m  Notable VS details: reviewed.   Fetal Assessment: FHT:  FHR: 130 bpm, variability: moderate,  accelerations:  Present,  decelerations:  Present 2-3 late decels, likely positional while pt on back getting catheter placed.  Resolved late decels.  Category/reactivity:  Category II UC:   regular, every 2-3 minutes; Pitocin at 11mu/min SVE:   6/70/-1, Cook catheter in vagina, removed with balloons intact. Fetal head well applied, AROM performed, mod amt clear fluid with some bloody show noted.  Membrane status: AROM at 1417 Amniotic color: clear  Labs: Lab Results  Component Value Date   WBC 10.7 (H) 06/27/2020   HGB 11.9 (L) 06/27/2020   HCT 34.6 (L) 06/27/2020   MCV 88.5 06/27/2020   PLT 243 06/27/2020    Assessment / Plan: Induction of labor due to Factor V Leiden with hx DVT, GDMA1,  progressing well on pitocin  Labor: progressing after [facility] cath, Pitocin and AROM.  Preeclampsia:  no e/o pre-e Fetal Wellbeing:  Category II Pain Control:  Epidural I/D:  GBS pos, s/p 2 doses.  Anticipated MOD:  NSVD  Randa Ngo, CNM 06/27/2020, 2:52 PM

## 2020-06-27 NOTE — Discharge Summary (Signed)
Obstetrical Discharge Summary  Patient Name: Hannah Patterson DOB: 1990-10-26 MRN: [id number]  Date of Admission: 06/27/2020 Date of Delivery: 06/27/20 Delivered by: Heloise Ochoa CNM Date of Discharge: 06/28/2020  Primary OB: [facility]  HBZ:JIRCVEL'F last menstrual period was 09/19/2019. EDC Estimated Date of Delivery: 07/01/20 Gestational Age at Delivery: [redacted]w[redacted]d   Antepartum complications:  1. GBS Positive, needs IP prophy 2. Factor V Leiden mutation with DVT, on ppx Lovenox  MFM recs (consult last preg 2018):   60mg  enoxaparin daily, hold 24 hours prior to planned delivery, resume 12h s/p SVD or 24h s/p C/S, pneumatic compresssion devices while off anticoagulation  Anticoagulation 6-12 weeks postpartum, can bridge to warfarin if 12w with bridging at least 2w PP.   05/15/20 Seen in Triage for left leg pain - Doppler was negative 3. BMI 40 pre-preg 4. Hx A1 Gestational diabetes last pregnancy; early GDMA1 this pregnancy at 12wks; well controlled with diet.   Admitting Diagnosis: induction of labor Secondary Diagnosis: SVD  Patient Active Problem List   Diagnosis Date Noted  . Factor V Leiden mutation complicating pregnancy (HCC) 06/27/2020  . Uterine contractions during pregnancy 05/15/2020  . Obesity (BMI 30-39.9) 12/02/2017  . Labor and delivery, indication for care 12/01/2017  . DVT (deep venous thrombosis) (HCC)   . Factor V Leiden (HCC) 10/07/2016  . Acute deep vein thrombosis (DVT) of popliteal vein of left lower extremity (HCC) 08/17/2016    Augmentation: AROM, Pitocin and IP Foley Complications: None Intrapartum complications/course: uncomplicated induction of labor Date of Delivery: 06/27/20 Delivered By: 06/29/20 CNM Delivery Type: spontaneous vaginal delivery Anesthesia: epidural Placenta: spontaneous Laceration: none Episiotomy: none Newborn Data: Live born female "Heloise Ochoa" Birth Weight:  6#5 APGAR: 9, 10  Newborn Delivery   Birth  date/time: 06/27/2020 17:01:00 Delivery type: Vaginal, Spontaneous      Postpartum Procedures: none  Edinburgh:  Edinburgh Postnatal Depression Scale Screening Tool 06/27/2020  I have been able to laugh and see the funny side of things. (No Data)      Post partum course:  Patient had an uncomplicated postpartum course. Lovenox restarted at 12hrs PP.  By time of discharge on PPD#1, her pain was controlled on oral pain medications; she had appropriate lochia and was ambulating, voiding without difficulty and tolerating regular diet.  She was deemed stable for discharge to home.     Discharge Physical Exam:  BP 112/67 (BP Location: Right Arm)   Pulse 72   Temp 97.8 F (36.6 C) (Oral)   Resp 20   Ht 5' (1.524 m)   Wt 93 kg   LMP 09/19/2019   SpO2 99% Comment: Room Air  Breastfeeding Unknown   BMI 40.04 kg/m   General: NAD CV: RRR Pulm: CTABL, nl effort ABD: s/nd/nt, fundus firm and below the umbilicus Lochia: moderate Perineum: intact DVT Evaluation: LE non-ttp, no evidence of DVT on exam.  Hemoglobin  Date Value Ref Range Status  06/28/2020 10.9 (L) 12.0 - 15.0 g/dL Final   HCT  Date Value Ref Range Status  06/28/2020 31.9 (L) 36.0 - 46.0 % Final     Disposition: stable, discharge to home. Baby Feeding: breastmilk Baby Disposition: home with mom  Rh Immune globulin given: n/a Rubella vaccine given: immune Varicella vaccine given: immune Tdap vaccine given in AP or PP setting: 04/2020 Flu vaccine given in AP or PP setting: 02/13/20  Contraception: partner vasectomy  Prenatal Labs:  Blood type/Rh  A Pos  Antibody screen neg  Rubella Immune  Varicella  Immune  RPR NR  HBsAg Neg  HIV NR  GC neg  Chlamydia neg  Genetic screening  declined  1 hour GTT  135- pt declined 3hr GTT  3 hour GTT n/a  GBS  Pos     Plan:  Katlynn Robyn Sconyers was discharged to home in good condition. Follow-up appointment with delivering provider in 6 weeks.  Discharge  Medications: Allergies as of 06/28/2020      Reactions   Cat Hair Extract Swelling      Medication List    STOP taking these medications   acetaminophen 650 MG CR tablet Commonly known as: TYLENOL Replaced by: acetaminophen 325 MG tablet     TAKE these medications   acetaminophen 325 MG tablet Commonly known as: Tylenol Take 2 tablets (650 mg total) by mouth every 4 (four) hours as needed (for pain scale < 4). Replaces: acetaminophen 650 MG CR tablet   aspirin 81 MG chewable tablet Chew 81 mg by mouth daily.   benzocaine-Menthol 20-0.5 % Aero Commonly known as: DERMOPLAST Apply 1 application topically as needed for irritation (perineal discomfort).   coconut oil Oil Apply 1 application topically as needed.   diphenhydrAMINE 25 mg capsule Commonly known as: BENADRYL Take 1 capsule (25 mg total) by mouth every 6 (six) hours as needed for itching.   enoxaparin 60 MG/0.6ML injection Commonly known as: LOVENOX Inject 60 mg into the skin daily.   ibuprofen 600 MG tablet Commonly known as: ADVIL Take 1 tablet (600 mg total) by mouth every 6 (six) hours. What changed:   when to take this  reasons to take this   Prenatal 28-0.8 MG Tabs Take 1 tablet by mouth daily.   senna-docusate 8.6-50 MG tablet Commonly known as: Senokot-S Take 2 tablets by mouth daily.   simethicone 80 MG chewable tablet Commonly known as: MYLICON Chew 1 tablet (80 mg total) by mouth as needed for flatulence.   valACYclovir 500 MG tablet Commonly known as: VALTREX Take 1 tablet (500 mg total) by mouth 2 (two) times daily. What changed:   medication strength  how much to take   witch hazel-glycerin pad Commonly known as: TUCKS Apply 1 application topically as needed for hemorrhoids.        Follow-up Information    Quamesha Mullet, Prudencio Pair, CNM Follow up in 6 week(s).   Specialty: Obstetrics and Gynecology Why: routine PP visit Contact information: [address] [place]  [address] [phone]               Signed:  Randa Ngo, CNM 06/28/2020  9:20 AM

## 2020-06-27 NOTE — H&P (Addendum)
OB History & Physical   History of Present Illness:  Chief Complaint: scheduled induction of labor  HPI:  Hannah Patterson is a 30 y.o. G2P1001 female at [redacted]w[redacted]d dated by [place] at [redacted]w[redacted]d, LMP was 09/19/19.  She presents to L&D for induction of labor due to Factor V and hx DVT; A1GDM.   Active FM; Irregular contractions, denies LOF. Small amt bloody show noted this morning prior to arrival.     Pregnancy Issues: 1. GBS Positive, needs IP prophy 2. Factor V Leiden mutation with DVT, on ppx Lovenox  MFM recs (consult last preg 2018):   60mg  enoxaparin daily, hold 24 hours prior to planned delivery, resume 12h s/p SVD or 24h s/p C/S, pneumatic compresssion devices while off anticoagulation  Anticoagulation 6-12 weeks postpartum, can bridge to warfarin if 12w with bridging at least 2w PP.   05/15/20 Seen in Triage for left leg pain - Doppler was negative 3. BMI 40 pre-preg 4. Hx A1 Gestational diabetes last pregnancy; early GDMA1 this pregnancy at 12wks; well controlled with diet.    Maternal Medical History:   Past Medical History:  Diagnosis Date  . DVT (deep venous thrombosis) (HCC)   . Factor V Leiden (HCC)    heterozygous  . Gestational diabetes   . Heart murmur     No past surgical history on file.  Allergies  Allergen Reactions  . Cat Hair Extract Swelling    Prior to Admission medications   Medication Sig Start Date End Date Taking? Authorizing Provider  acetaminophen (TYLENOL) 650 MG CR tablet Take 650 mg by mouth every 8 (eight) hours as needed.    [provider]  aspirin 81 MG chewable tablet Chew 81 mg by mouth daily.    [provider]  enoxaparin (LOVENOX) 60 MG/0.6ML injection Inject 60 mg into the skin daily. 06/23/17   [provider]  ibuprofen (ADVIL,MOTRIN) 600 MG tablet Take 1 tablet (600 mg total) by mouth every 6 (six) hours as needed. 12/03/17   Ward, 02/03/18, MD  PRENATAL 28-0.8 MG TABS Take 1 tablet by mouth daily.     [provider]     Prenatal care site: [facility]  Social History: She  reports that she has never smoked. She has never used smokeless tobacco. She reports that she does not drink alcohol and does not use drugs.  Family History: family history includes Alcohol abuse in her father; Cancer in her maternal aunt; Diabetes in her father; Hypertension in her mother; Miscarriages / Stillbirths in her paternal grandmother; Pulmonary embolism in her maternal grandmother.   Review of Systems: A full review of systems was performed and negative except as noted in the HPI.     Physical Exam:  Vital Signs: LMP 09/19/2019  General: no acute distress.  HEENT: normocephalic, atraumatic Heart: regular rate & rhythm.  No murmurs/rubs/gallops Lungs: clear to auscultation bilaterally, normal respiratory effort Abdomen: soft, gravid, non-tender;  EFW: 7lbs Pelvic:   External: Normal external female genitalia   Extremities: non-tender, symmetric, no edema bilaterally.  DTRs: 2+  Neurologic: Alert & oriented x 3.    No results found for this or any previous visit (from the past 24 hour(s)).  Pertinent Results:  Prenatal Labs: Blood type/Rh  A Pos  Antibody screen neg  Rubella Immune  Varicella Immune  RPR NR  HBsAg Neg  HIV NR  GC neg  Chlamydia neg  Genetic screening  declined  1 hour GTT  135- pt declined 3hr  GTT  3 hour GTT n/a  GBS  Pos   FHT: 140bpm, mod variability, + accels, no decels TOCO: irreg SVE: 3/50/-2, soft/posteior, Cook cath placed. Uterine balloon 97ml/vaginal balloon 42ml   Cephalic by leopolds  No results found.  Assessment:  Hannah Patterson is a 30 y.o. G2P1001 female at [redacted]w[redacted]d with factor V Leiden and hx DVT; A1GDM  Plan:  1. Admit to Labor & Delivery; consents reviewed and obtained - COVID swab on admit.   2. Fetal Well being  - Fetal Tracing: Cat I  - Group B Streptococcus ppx indicated: Positive - Presentation: cephalic  confirmed by exam and [place].    3. Routine OB: - Prenatal labs reviewed, as above - Rh A pos - CBC, T&S, RPR on admit - Clear fluids, IVF  4. Induction of Labor -  Contractions: external toco in place -  Pelvis proven to 2530g -  Plan for induction with [facility] cath, pitocin and AROM.  -  Plan for continuous fetal monitoring  -  Maternal pain control as desired - Anticipate vaginal delivery  5. Post Partum Planning: - Infant feeding: ;breast - Contraception: spouse vasectomy - Flu: 02/13/20 - Tdap: 04/2020  Prudencio Pair Thi Sisemore, CNM 06/27/20 10:39 AM

## 2020-06-27 NOTE — Anesthesia Procedure Notes (Signed)
Epidural Patient location during procedure: OB Start time: 06/27/2020 1:37 PM End time: 06/27/2020 1:42 PM  Staffing Anesthesiologist: Azlan Hanway, Cleda Mccreedy, MD Performed: anesthesiologist   Preanesthetic Checklist Completed: patient identified, IV checked, site marked, risks and benefits discussed, surgical consent, monitors and equipment checked, pre-op evaluation and timeout performed  Epidural Patient position: sitting Prep: ChloraPrep Patient monitoring: heart rate, continuous pulse ox and blood pressure Approach: midline Location: L3-L4 Injection technique: LOR saline  Needle:  Needle type: Tuohy  Needle gauge: 17 G Needle length: 9 cm and 9 Needle insertion depth: 7 cm Catheter type: closed end flexible Catheter size: 19 Gauge Catheter at skin depth: 12 cm Test dose: negative and 1.5% lidocaine with Epi 1:200 K  Assessment Sensory level: T10 Events: blood not aspirated, injection not painful, no injection resistance, no paresthesia and negative IV test  Additional Notes 1 attempt Pt. Evaluated and documentation done after procedure finished. Patient identified. Risks/Benefits/Options discussed with patient including but not limited to bleeding, infection, nerve damage, paralysis, failed block, incomplete pain control, headache, blood pressure changes, nausea, vomiting, reactions to medication both or allergic, itching and postpartum back pain. Confirmed with bedside nurse the patient's most recent platelet count. Confirmed with patient that they are not currently taking any anticoagulation, have any bleeding history or any family history of bleeding disorders. Patient expressed understanding and wished to proceed. All questions were answered. Sterile technique was used throughout the entire procedure. Please see nursing notes for vital signs. Test dose was given through epidural catheter and negative prior to continuing to dose epidural or start infusion. Warning signs of  high block given to the patient including shortness of breath, tingling/numbness in hands, complete motor block, or any concerning symptoms with instructions to call for help. Patient was given instructions on fall risk and not to get out of bed. All questions and concerns addressed with instructions to call with any issues or inadequate analgesia.   Patient tolerated the insertion well without immediate complications.Reason for block:procedure for pain

## 2020-06-27 NOTE — Anesthesia Preprocedure Evaluation (Signed)
Anesthesia Evaluation  Patient identified by MRN, date of birth, ID band Patient awake    Reviewed: Allergy & Precautions, H&P , NPO status , Patient's Chart, lab work & pertinent test results  History of Anesthesia Complications (+) history of anesthetic complications (difficult epidural)  Airway Mallampati: III  TM Distance: >3 FB Neck ROM: full    Dental  (+) Chipped   Pulmonary neg pulmonary ROS, neg shortness of breath,    Pulmonary exam normal        Cardiovascular Exercise Tolerance: Good (-) hypertensionNormal cardiovascular exam     Neuro/Psych    GI/Hepatic negative GI ROS,   Endo/Other  diabetes  Renal/GU   negative genitourinary   Musculoskeletal   Abdominal   Peds  Hematology negative hematology ROS (+)   Anesthesia Other Findings Past Medical History: No date: DVT (deep venous thrombosis) (HCC) No date: Factor V Leiden (HCC)     Comment:  heterozygous No date: Gestational diabetes No date: Heart murmur  History reviewed. No pertinent surgical history.  BMI    Body Mass Index: 40.04 kg/m      Reproductive/Obstetrics (+) Pregnancy                             Anesthesia Physical Anesthesia Plan  ASA: III  Anesthesia Plan: Epidural   Post-op Pain Management:    Induction:   PONV Risk Score and Plan:   Airway Management Planned: Natural Airway  Additional Equipment:   Intra-op Plan:   Post-operative Plan:   Informed Consent: I have reviewed the patients History and Physical, chart, labs and discussed the procedure including the risks, benefits and alternatives for the proposed anesthesia with the patient or authorized representative who has indicated his/her understanding and acceptance.     Dental Advisory Given  Plan Discussed with: Anesthesiologist  Anesthesia Plan Comments: (Patient had been off of enoxaparin for over 24 hours.  Plan to re  start daily enoxaparin prophlaxis after delivery.  Spoke with patient and nursing staff, that patient should be at least 12 hours post epidural catheter removal before re starting daily enoxaparin prophylaxis.  Patient reports no bleeding problems and no other anticoagulant use.   Patient consented for risks of anesthesia including but not limited to:  - adverse reactions to medications - risk of bleeding, infection and or nerve damage from epidural that could lead to paralysis - risk of headache or failed epidural - nerve damage due to positioning - that if epidural is used for C-section that there is a chance of epidural failure requiring spinal placement or conversion to GA - Damage to heart, brain, lungs, other parts of body or loss of life  Patient voiced understanding.)        Anesthesia Quick Evaluation

## 2020-06-28 LAB — CBC
HCT: 31.9 % — ABNORMAL LOW (ref 36.0–46.0)
Hemoglobin: 10.9 g/dL — ABNORMAL LOW (ref 12.0–15.0)
MCH: 30.8 pg (ref 26.0–34.0)
MCHC: 34.2 g/dL (ref 30.0–36.0)
MCV: 90.1 fL (ref 80.0–100.0)
Platelets: 225 10*3/uL (ref 150–400)
RBC: 3.54 MIL/uL — ABNORMAL LOW (ref 3.87–5.11)
RDW: 14.6 % (ref 11.5–15.5)
WBC: 13.5 10*3/uL — ABNORMAL HIGH (ref 4.0–10.5)
nRBC: 0 % (ref 0.0–0.2)

## 2020-06-28 LAB — RPR: RPR Ser Ql: NONREACTIVE

## 2020-06-28 MED ORDER — COCONUT OIL OIL: 1.0000 "application " | TOPICAL_OIL | 0 refills | Status: DC | PRN

## 2020-06-28 MED ORDER — BENZOCAINE-MENTHOL 20-0.5 % EX AERO: 1.0000 "application " | INHALATION_SPRAY | CUTANEOUS | Status: DC | PRN

## 2020-06-28 MED ORDER — ACETAMINOPHEN 325 MG PO TABS: 650.0000 mg | ORAL_TABLET | ORAL | 0 refills | Status: DC | PRN

## 2020-06-28 MED ORDER — DIPHENHYDRAMINE HCL 25 MG PO CAPS: 25.0000 mg | ORAL_CAPSULE | Freq: Four times a day (QID) | ORAL | 0 refills | Status: DC | PRN

## 2020-06-28 MED ORDER — IBUPROFEN 600 MG PO TABS: 600.0000 mg | ORAL_TABLET | Freq: Four times a day (QID) | ORAL | 0 refills | Status: DC

## 2020-06-28 MED ORDER — WITCH HAZEL-GLYCERIN EX PADS: 1.0000 "application " | MEDICATED_PAD | CUTANEOUS | 12 refills | Status: DC | PRN

## 2020-06-28 MED ORDER — VALACYCLOVIR HCL 500 MG PO TABS: 500.0000 mg | ORAL_TABLET | Freq: Two times a day (BID) | ORAL | 0 refills | Status: DC

## 2020-06-28 MED ORDER — SENNOSIDES-DOCUSATE SODIUM 8.6-50 MG PO TABS: 2.0000 | ORAL_TABLET | Freq: Every day | ORAL | 0 refills | Status: DC

## 2020-06-28 MED ORDER — SIMETHICONE 80 MG PO CHEW: 80.0000 mg | CHEWABLE_TABLET | ORAL | 0 refills | Status: DC | PRN

## 2020-06-28 NOTE — Progress Notes (Signed)
Reviewed D/C instructions with pt and family. Pt verbalized understanding of teaching. Discharged to home via W/C. Pt to schedule f/u appt.  

## 2020-06-28 NOTE — Lactation Note (Signed)
This note was copied from a baby's chart. Lactation Consultation Note  Patient Name: Hannah Patterson Today's Date: 06/28/2020 Reason for consult: Initial assessment Age:29 hours  Lactation to the room for initial visit. Mother stated baby has been feeding well and was sleepy after circ. Reviewed baby should begin to feed more vigorously after 24 HOL and 6 hours after circ procedure. Baby is latched and feeding in football on the right. Encouraged feeding on demand and with cues. If baby is not cueing encouraged hand expression and skin to skin. Discussed hand expression and shown in booklet reference page.   Encouraged 8 or more good feeds after 24 HOL. Reviewed appropriate diapers for days of life and How to know your baby is getting enough to eat. Reviewed "Understanding Postpartum and Newborn Care" booklet at bedside. Reviewed LC office # for outpatient use if needed. Mother has no further questions at this time.   Maternal Data Formula Feeding for Exclusion: No Has patient been taught Hand Expression?: Yes Does the patient have breastfeeding experience prior to this delivery?: Yes  Feeding Feeding Type:  (baby was latched and feeding when LC was in the room.)  LATCH Score Latch: Grasps breast easily, tongue down, lips flanged, rhythmical sucking.  Audible Swallowing: Spontaneous and intermittent  Type of Nipple: Everted at rest and after stimulation  Comfort (Breast/Nipple): Soft / non-tender  Hold (Positioning): No assistance needed to correctly position infant at breast.  LATCH Score: 10  Interventions Interventions: Breast feeding basics reviewed;Adjust position  Lactation Tools Discussed/Used     Consult Status Consult Status: PRN    Donovyn Guidice D Alexiz Sustaita 06/28/2020, 12:54 PM

## 2020-06-28 NOTE — Progress Notes (Signed)
Post Partum Day 1 Subjective: Doing well, no complaints.  Tolerating regular diet, pain with PO meds, voiding and ambulating without difficulty.cramping and back pain are slightly worse than with 1st baby. They have decided on a name for baby: "Normand Sloop" and will call him Remigio Eisenmenger.   No CP SOB Fever,Chills, N/V or leg pain; denies nipple or breast pain, no HA change of vision, RUQ/epigastric pain  Objective: BP 112/67 (BP Location: Right Arm)   Pulse 72   Temp 97.8 F (36.6 C) (Oral)   Resp 20   Ht 5' (1.524 m)   Wt 93 kg   LMP 09/19/2019   SpO2 99% Comment: Room Air  Breastfeeding Unknown   BMI 40.04 kg/m    Physical Exam:  General: NAD Breasts: soft/nontender CV: RRR Pulm: nl effort, CTABL Abdomen: soft, NT, BS x 4 Perineum: minimal edema, intact Lochia: small to mod Uterine Fundus: fundus firm and 1 fb below umbilicus DVT Evaluation: no cords, ttp LEs   Recent Labs    06/27/20 1047 06/28/20 0506  HGB 11.9* 10.9*  HCT 34.6* 31.9*  WBC 10.7* 13.5*  PLT 243 225    Assessment/Plan: 30 y.o. G2P2002 postpartum day # 1  - Continue routine PP care - Lactation consult prn - Discussed contraceptive options: plans to use condoms until spouse vasectomy - Immunization status: all Imms up to date - desires DC home at 24hrs after baby meets all goals.    Disposition: Does desire Dc home today.     Randa Ngo, CNM 06/28/2020  9:16 AM

## 2020-06-28 NOTE — Anesthesia Postprocedure Evaluation (Signed)
Anesthesia Post Note  Patient: Hannah Patterson, quantity  Procedure(s) Performed: AN AD HOC LABOR EPIDURAL  Patient location during evaluation: Mother Baby Anesthesia Type: Epidural Level of consciousness: awake and alert and oriented Pain management: pain level controlled Vital Signs Assessment: post-procedure vital signs reviewed and stable Respiratory status: spontaneous breathing Cardiovascular status: blood pressure returned to baseline Postop Assessment: no headache Anesthetic complications: no Comments: Very minimal soreness .   No complications documented.   Last Vitals:  Vitals:   06/28/20 0451 06/28/20 0842  BP: 102/72 112/67  Pulse: 76 72  Resp:  20  Temp: 36.7 C 36.6 C  SpO2: 100% 99%    Last Pain:  Vitals:   06/28/20 0842  TempSrc: Oral  PainSc:                  Emerita Berkemeier

## 2020-06-28 NOTE — Plan of Care (Signed)
  Problem: Role Relationship: Goal: Ability to demonstrate positive interaction with newborn will improve Outcome: Progressing   Problem: Life Cycle: Goal: Chance of risk for complications during the postpartum period will decrease Outcome: Progressing   Problem: Coping: Goal: Ability to identify and utilize available resources and services will improve Outcome: Progressing

## 2021-04-08 ENCOUNTER — Other Ambulatory Visit: Payer: Self-pay

## 2021-04-08 ENCOUNTER — Ambulatory Visit
Admission: EM | Admit: 2021-04-08 | Discharge: 2021-04-08 | Disposition: A | Discharge status: Home / Self Care | Payer: BC Managed Care – PPO | Attending: Emergency Medicine | Admitting: Emergency Medicine

## 2021-04-08 DIAGNOSIS — B349 Viral infection, unspecified: Secondary | ICD-10-CM | POA: Diagnosis present

## 2021-04-08 LAB — RAPID INFLUENZA A&B ANTIGENS
Influenza A (ARMC): NEGATIVE
Influenza B (ARMC): NEGATIVE

## 2021-04-08 NOTE — ED Triage Notes (Signed)
Pt here with C/O Flu like symptoms last night, body aches, fever (101.8).

## 2021-04-08 NOTE — ED Triage Notes (Signed)
Pt's 39month old son has Hand, foot, mouth

## 2021-04-08 NOTE — ED Provider Notes (Signed)
, [facility]    CSN: [id number] Arrival date & time: 04/08/21  0820      History   Chief Complaint Chief Complaint  Patient presents with   Generalized Body Aches   Fever    HPI Hannah Patterson is a 30 y.o. female.   Patient presents with, body aches generalized abdominal pain, nausea without vomiting and diarrhea for 1 day.  Poor appetite but tolerating fluids.  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  Has not attempted treatment of symptoms.  History of heart murmur, DVT and factor V Leiden mutation. Denies headaches, ear pain, sore throat, nasal congestion, rhinorrhea, cough, shortness of breath, wheezing.  Past Medical History:  Diagnosis Date   DVT (deep venous thrombosis) (HCC)    Factor V Leiden (HCC)    heterozygous   Gestational diabetes    Heart murmur     Patient Active Problem List   Diagnosis Date Noted   Factor V Leiden mutation complicating pregnancy (HCC) 06/27/2020   Uterine contractions during pregnancy 05/15/2020   Obesity (BMI 30-39.9) 12/02/2017   Labor and delivery, indication for care 12/01/2017   DVT (deep venous thrombosis) (HCC)    Factor V Leiden (HCC) 10/07/2016   Acute deep vein thrombosis (DVT) of popliteal vein of left lower extremity (HCC) 08/17/2016    History reviewed. No pertinent surgical history.  OB History     Gravida  2   Para  2   Term  2   Preterm  0   AB  0   Living  2      SAB  0   IAB  0   Ectopic  0   Multiple  0   Live Births  2            Home Medications    Prior to Admission medications   Medication Sig Start Date End Date Taking? Authorizing Provider  valACYclovir (VALTREX) 500 MG tablet Take 1 tablet (500 mg total) by mouth 2 (two) times daily. 06/28/20  Yes McVey, Prudencio Pair, CNM  acetaminophen (TYLENOL) 325 MG tablet Take 2 tablets (650 mg total) by mouth every 4 (four) hours as needed (for pain scale < 4). 06/28/20   McVey, Prudencio Pair, CNM  aspirin  81 MG chewable tablet Chew 81 mg by mouth daily.    [provider]  benzocaine-Menthol (DERMOPLAST) 20-0.5 % AERO Apply 1 application topically as needed for irritation (perineal discomfort). 06/28/20   McVey, Prudencio Pair, CNM  coconut oil OIL Apply 1 application topically as needed. 06/28/20   McVey, Prudencio Pair, CNM  diphenhydrAMINE (BENADRYL) 25 mg capsule Take 1 capsule (25 mg total) by mouth every 6 (six) hours as needed for itching. 06/28/20   McVey, Prudencio Pair, CNM  enoxaparin (LOVENOX) 60 MG/0.6ML injection Inject 60 mg into the skin daily. 06/23/17   [provider]  ibuprofen (ADVIL) 600 MG tablet Take 1 tablet (600 mg total) by mouth every 6 (six) hours. 06/28/20   McVey, Prudencio Pair, CNM  PRENATAL 28-0.8 MG TABS Take 1 tablet by mouth daily.    [provider]  senna-docusate (SENOKOT-S) 8.6-50 MG tablet Take 2 tablets by mouth daily. 06/28/20   McVey, Prudencio Pair, CNM  simethicone (MYLICON) 80 MG chewable tablet Chew 1 tablet (80 mg total) by mouth as needed for flatulence. 06/28/20   McVey, Prudencio Pair, CNM  witch hazel-glycerin (TUCKS) pad Apply 1 application topically as needed for hemorrhoids. 06/28/20  McVey, Murray Hodgkins, CNM    Family History Family History  Problem Relation Age of Onset   Cancer Maternal Aunt    Hypertension Mother    Alcohol abuse Father    Diabetes Father        meds from liver transplant   Pulmonary embolism Maternal Grandmother    Miscarriages / Stillbirths Paternal Grandmother     Social History Social History   Tobacco Use   Smoking status: Never   Smokeless tobacco: Never  Vaping Use   Vaping Use: Never used  Substance Use Topics   Alcohol use: No   Drug use: No     Allergies   Cat hair extract   Review of Systems Review of Systems  Constitutional:  Positive for appetite change and fever. Negative for activity change, chills, diaphoresis, fatigue and unexpected weight change.  HENT: Negative.    Gastrointestinal:   Positive for abdominal pain, diarrhea and nausea. Negative for abdominal distention, anal bleeding, blood in stool, constipation, rectal pain and vomiting.  Skin: Negative.   Neurological: Negative.     Physical Exam Triage Vital Signs ED Triage Vitals  Enc Vitals Group     BP 04/08/21 0852 109/76     Pulse Rate 04/08/21 0852 (!) 101     Resp 04/08/21 0852 18     Temp 04/08/21 0852 99.1 F (37.3 C)     Temp Source 04/08/21 0852 Oral     SpO2 04/08/21 0852 98 %     Weight 04/08/21 0850 180 lb (81.6 kg)     Height 04/08/21 0850 5' (1.524 m)     Head Circumference --      Peak Flow --      Pain Score 04/08/21 0850 3     Pain Loc --      Pain Edu? --      Excl. in [place]? --    No data found.  Updated Vital Signs BP 109/76 (BP Location: Left Arm)   Pulse (!) 101   Temp 99.1 F (37.3 C) (Oral)   Resp 18   Ht 5' (1.524 m)   Wt 180 lb (81.6 kg)   SpO2 98%   Breastfeeding Yes   BMI 35.15 kg/m   Visual Acuity Right Eye Distance:   Left Eye Distance:   Bilateral Distance:    Right Eye Near:   Left Eye Near:    Bilateral Near:     Physical Exam Constitutional:      Appearance: Normal appearance.  HENT:     Right Ear: Tympanic membrane, ear canal and external ear normal.     Left Ear: Tympanic membrane, ear canal and external ear normal.     Nose: Nose normal.     Mouth/Throat:     Mouth: Mucous membranes are moist.     Pharynx: Oropharynx is clear.  Eyes:     Extraocular Movements: Extraocular movements intact.  Cardiovascular:     Rate and Rhythm: Normal rate and regular rhythm.     Pulses: Normal pulses.     Heart sounds: Normal heart sounds.  Pulmonary:     Effort: Pulmonary effort is normal.     Breath sounds: Normal breath sounds.  Abdominal:     General: Abdomen is flat. Bowel sounds are increased.     Palpations: Abdomen is soft.     Tenderness: There is no abdominal tenderness.  Skin:    General: Skin is warm and dry.  Neurological:     Mental  Status: She is alert and oriented to person, place, and time. Mental status is at baseline.  Psychiatric:        Mood and Affect: Mood normal.        Behavior: Behavior normal.     UC Treatments / Results  Labs (all labs ordered are listed, but only abnormal results are displayed) Labs Reviewed  RAPID INFLUENZA A&B ANTIGENS    EKG   Radiology No results found.  Procedures Procedures (including critical care time)  Medications Ordered in UC Medications - No data to display  Initial Impression / Assessment and Plan / UC Course  I have reviewed the triage vital signs and the nursing notes.  Pertinent labs & imaging results that were available during my care of the patient were reviewed by me and considered in my medical decision making (see chart for details).  Viral illness  Discussed etiology of symptoms, timeline and possible resolution with patient  1.  Flu test pending 2.  Over-the-counter medications for symptom management, patient is currently breast-feeding 3.  Follow-up with urgent care as needed Final Clinical Impressions(s) / UC Diagnoses   Final diagnoses:  None   Discharge Instructions   None    ED Prescriptions   None    PDMP not reviewed this encounter.   Hans [place], [place] 04/08/21 [phone]

## 2021-04-08 NOTE — Discharge Instructions (Signed)
We will contact you if your flu is positive.  Please quarantine while you wait for the results.  If your test is negative you may resume normal activities.  If your test is positive please continue to quarantine  until you are without a fever for at least 24 hours after the medications.    You can take Tylenol and/or Ibuprofen as needed for fever reduction and pain relief.   For cough: honey 1/2 to 1 teaspoon (you can dilute the honey in water or another fluid).  You can also use guaifenesin and dextromethorphan for cough. You can use a humidifier for chest congestion and cough.  If you don't have a humidifier, you can sit in the bathroom with the hot shower running.      For sore throat: try warm salt water gargles, cepacol lozenges, throat spray, warm tea or water with lemon/honey, popsicles or ice, or OTC cold relief medicine for throat discomfort.   For congestion: take a daily anti-histamine like Zyrtec, Claritin, and a oral decongestant, such as pseudoephedrine.  You can also use Flonase 1-2 sprays in each nostril daily.   It is important to stay hydrated: drink plenty of fluids (water, gatorade/powerade/pedialyte, juices, or teas) to keep your throat moisturized and help further relieve irritation/discomfort.

## 2021-08-26 DIAGNOSIS — Z86718 Personal history of other venous thrombosis and embolism: Secondary | ICD-10-CM | POA: Insufficient documentation

## 2021-08-29 DIAGNOSIS — Z87898 Personal history of other specified conditions: Secondary | ICD-10-CM | POA: Insufficient documentation

## 2021-08-29 DIAGNOSIS — Z8619 Personal history of other infectious and parasitic diseases: Secondary | ICD-10-CM | POA: Insufficient documentation

## 2022-05-30 NOTE — L&D Delivery Note (Signed)
Delivery Note  Date of delivery: 12/30/2022 Estimated Date of Delivery: 01/17/23 No LMP recorded. Patient is pregnant. EGA: [redacted]w[redacted]d  Delivery Note At 11:44 PM a viable female was delivered via Vaginal, Spontaneous (Presentation: Left Occiput Anterior).  APGAR: 8, 9; weight pending.  Placenta status: Spontaneous, Intact.  Cord: 3 vessels with the following complications: None.    First Stage: Labor onset: unknown Induction : Cytotec and AROM Analgesia /Anesthesia intrapartum: Epidural AROM at [employer] presented to L&D with IUGR. She was inducted with cytotec. Epidural placed for pain relief.   Second Stage: Complete dilation at 2339 Onset of pushing at 2339 FHR second stage Cat II / Cat III Delivery at 2344 on 12/30/2022  She progressed to complete and had a spontaneous vaginal birth of a live female over an intact perineum.  Due to [facility] pt was encouraged to push despite not having a contraction, she was able to deliver within 3 pushes. The fetal head was delivered in OA position with restitution to LOA. No nuchal cord. Anterior then posterior shoulders delivered spontaneously with minimal assistance. Baby placed on mom's abdomen and attended to by transition RN and NNP. Cord clamped and cut after 1+ mins by FOB.   Third Stage: Placenta delivered intact with 3VC at 2351 Placenta disposition: Pathology Uterine tone firm / bleeding min IV pitocin given for hemorrhage prophylaxis  Anesthesia: Epidural Episiotomy: None Lacerations: None Suture Repair: n/a Est. Blood Loss (mL): 350  Complications: none  Mom to postpartum.  Baby to Couplet care / Skin to Skin.  Newborn: Birth Weight: pending  Apgar Scores: 8, 9 Feeding planned: Breastfeeding    Cyril Mourning, CNM 12/30/2022 12:34 AM

## 2022-06-09 IMAGING — US US EXTREM LOW VENOUS*L*
1 series · 13 of 24 positions shown · non-contrast
Comparison: None.

CLINICAL DATA: Left lower extremity pain and edema.



[Series 1: us venous img lower uni left (dvt) · portal-venous · 13 of 34 slices shown]
[im 1/34]
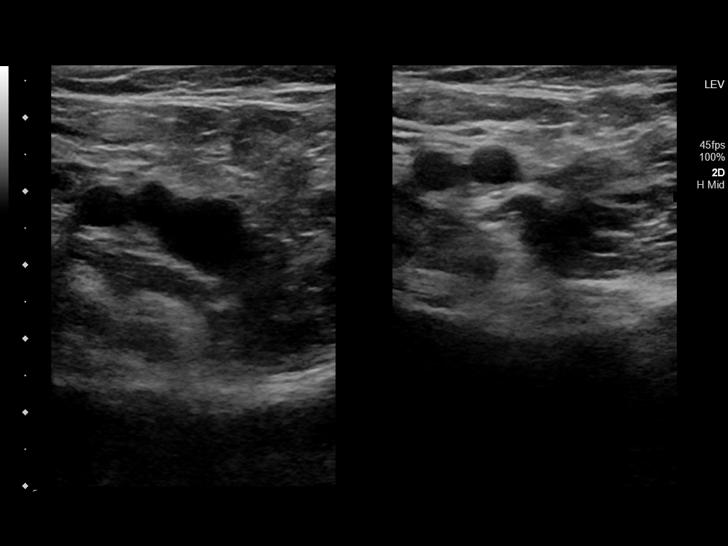
[im 3/34]
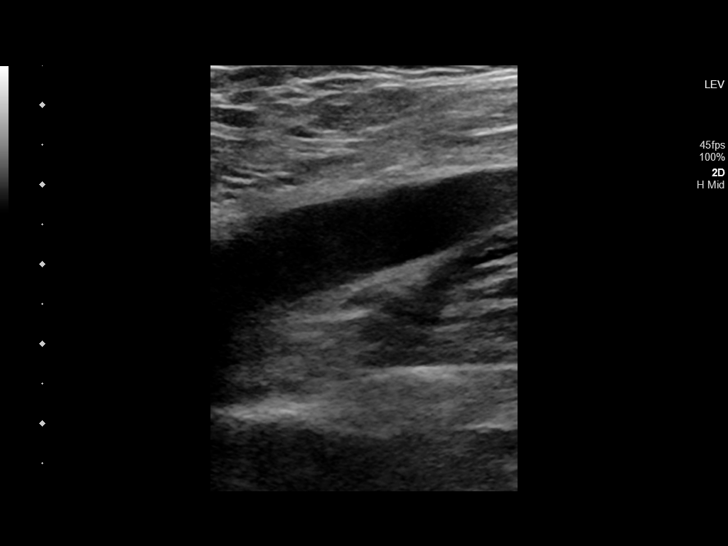
[im 6/34]
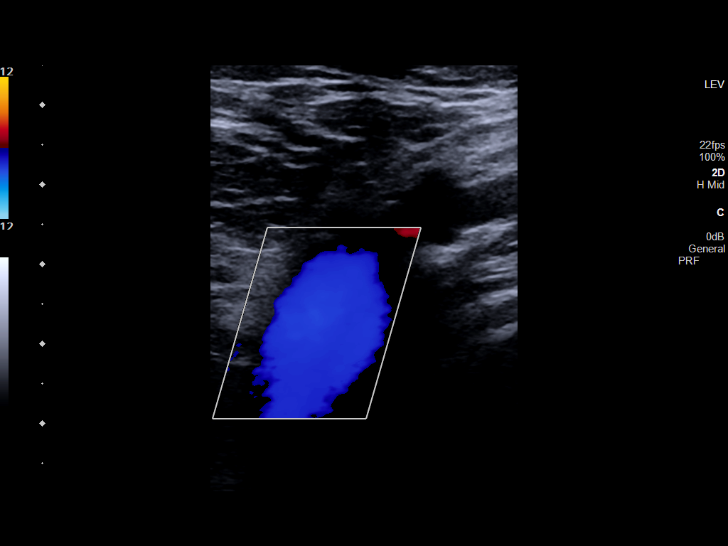
[im 9/34]
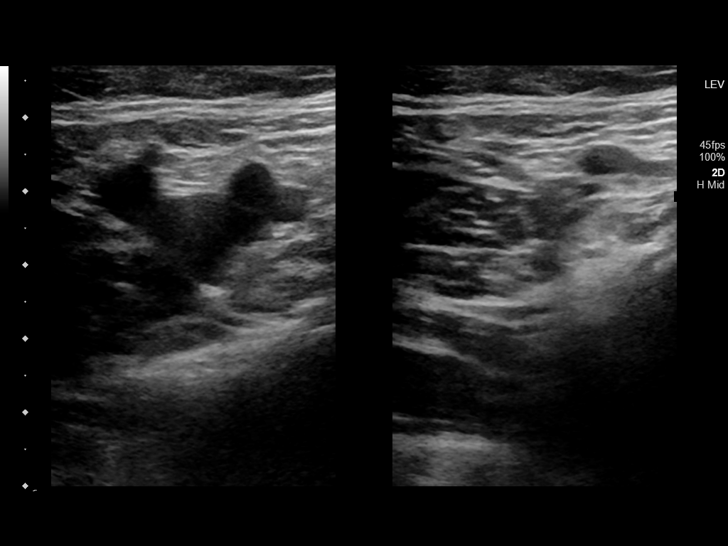
[im 12/34]
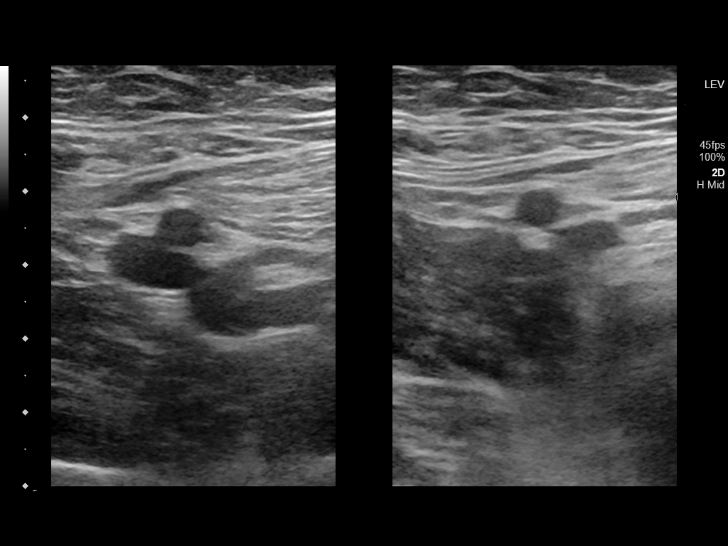
[im 15/34]
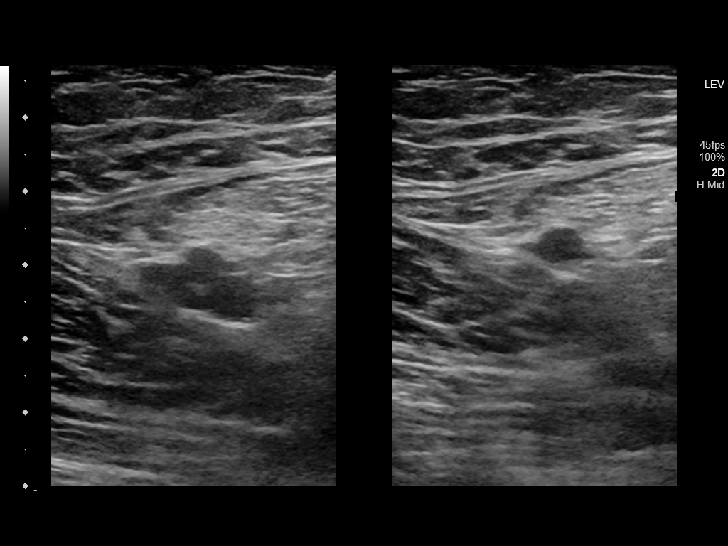
[im 18/34]
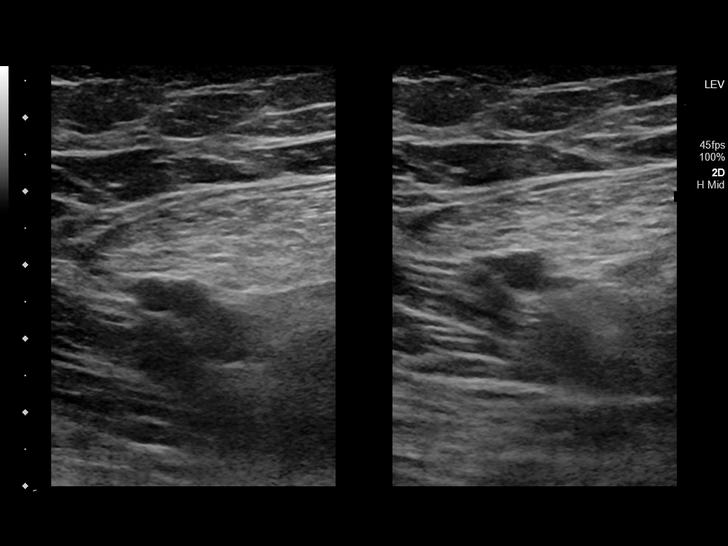
[im 19/34]
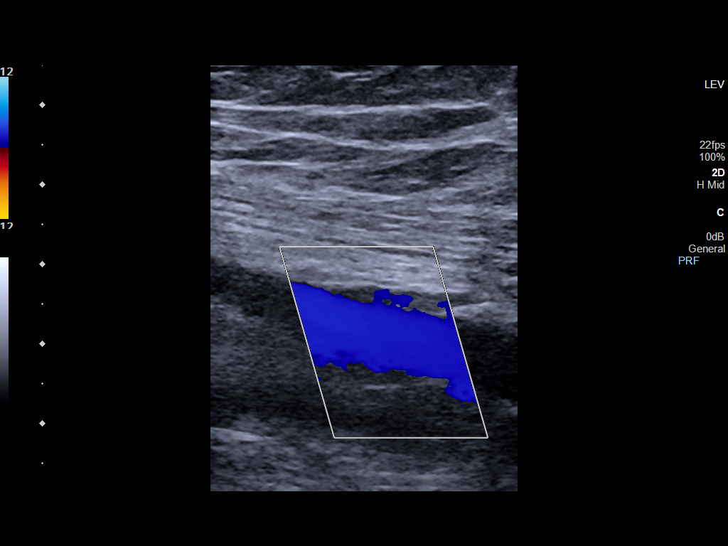
[im 22/34]
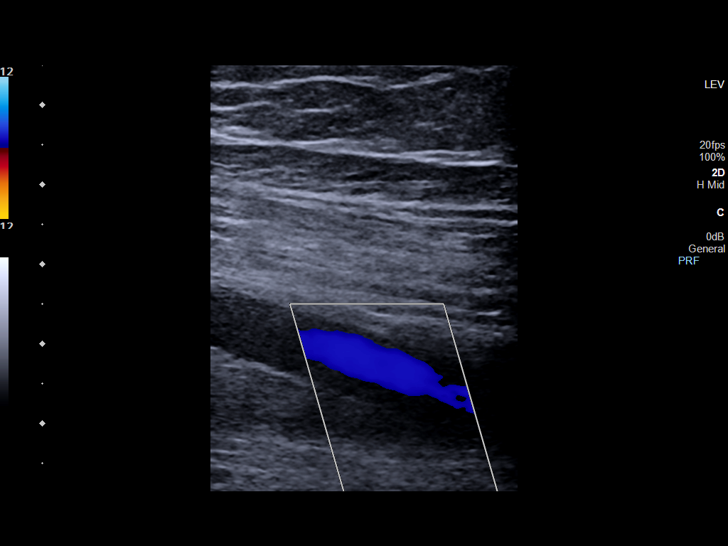
[im 25/34]
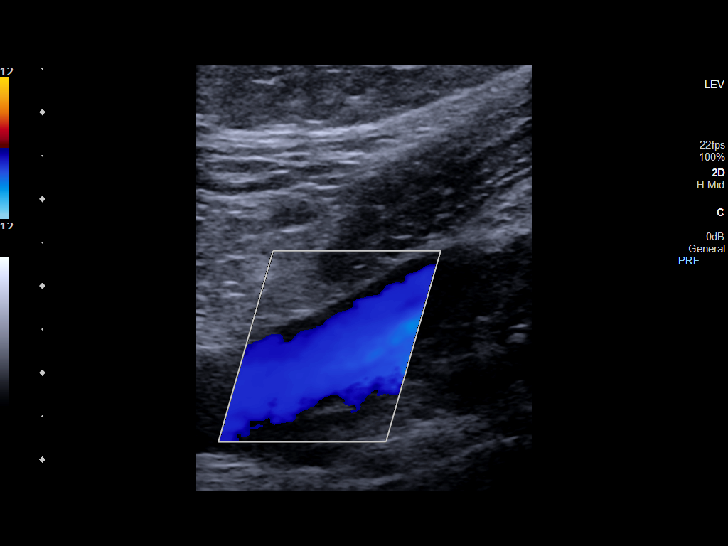
[im 28/34]
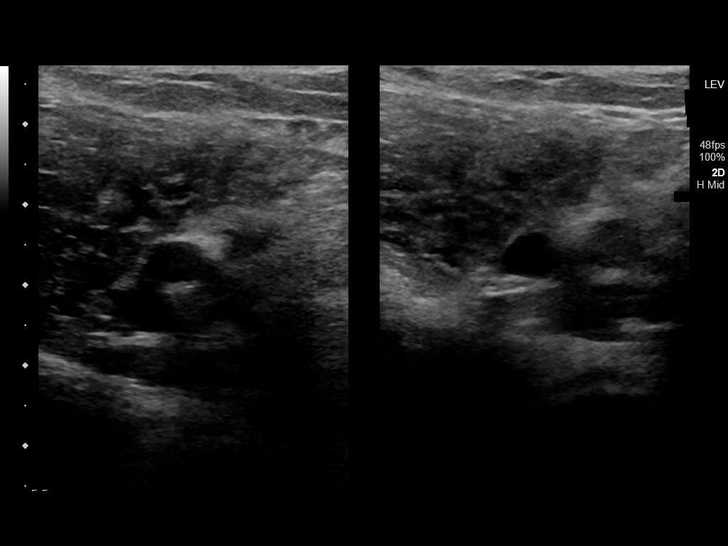
[im 31/34]
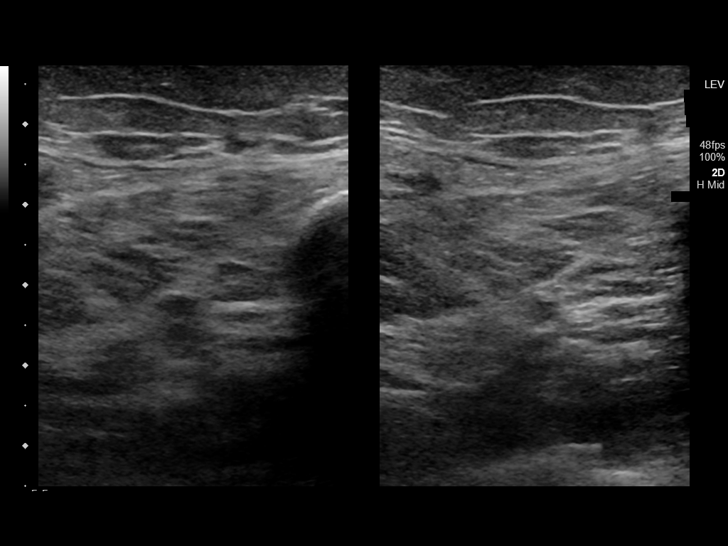
[im 34/34]
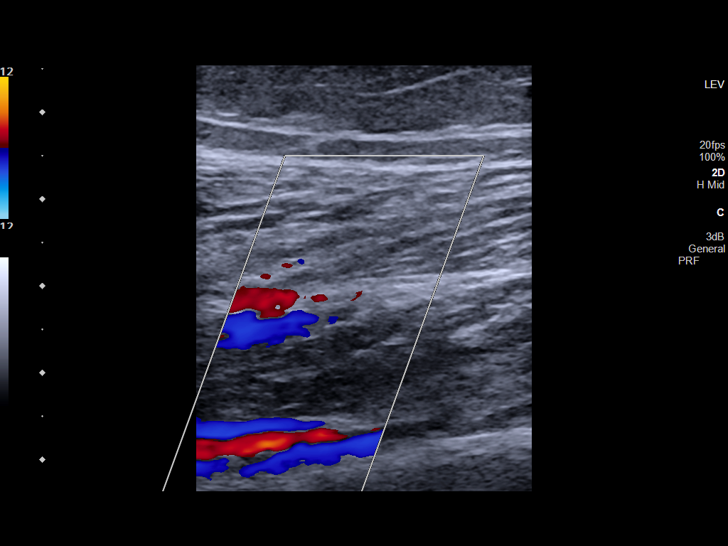

[13 of 24 positions shown; findings below may reference images not displayed]

FINDINGS: Contralateral Common Femoral Vein: Respiratory phasicity is normal
and symmetric with the symptomatic side. No evidence of thrombus.
Normal compressibility.

Common Femoral Vein: No evidence of thrombus. Normal
compressibility, respiratory phasicity and response to augmentation.

Saphenofemoral Junction: No evidence of thrombus. Normal
compressibility and flow on color Doppler imaging.

Profunda Femoral Vein: No evidence of thrombus. Normal
compressibility and flow on color Doppler imaging.

Femoral Vein: No evidence of thrombus. Normal compressibility,
respiratory phasicity and response to augmentation.

Popliteal Vein: No evidence of thrombus. Normal compressibility,
respiratory phasicity and response to augmentation.

Calf Veins: No evidence of thrombus. Normal compressibility and flow
on color Doppler imaging.

Superficial Great Saphenous Vein: No evidence of thrombus. Normal
compressibility.

Venous Reflux:  None.

Other Findings: No evidence of superficial thrombophlebitis or
abnormal fluid collection.
IMPRESSION: No evidence of left lower extremity deep venous thrombosis.

## 2022-06-22 DIAGNOSIS — O0993 Supervision of high risk pregnancy, unspecified, third trimester: Secondary | ICD-10-CM | POA: Insufficient documentation

## 2022-06-24 LAB — OB RESULTS CONSOLE RUBELLA ANTIBODY, IGM: Rubella: NON-IMMUNE/NOT IMMUNE

## 2022-06-24 LAB — OB RESULTS CONSOLE VARICELLA ZOSTER ANTIBODY, IGG: Varicella: IMMUNE

## 2022-12-12 DIAGNOSIS — R7989 Other specified abnormal findings of blood chemistry: Secondary | ICD-10-CM | POA: Insufficient documentation

## 2022-12-12 DIAGNOSIS — H2011 Chronic iridocyclitis, right eye: Secondary | ICD-10-CM | POA: Insufficient documentation

## 2022-12-22 LAB — OB RESULTS CONSOLE GC/CHLAMYDIA
Chlamydia: NEGATIVE
Neisseria Gonorrhea: NEGATIVE

## 2022-12-22 LAB — OB RESULTS CONSOLE RPR: RPR: NONREACTIVE

## 2022-12-22 LAB — OB RESULTS CONSOLE HIV ANTIBODY (ROUTINE TESTING): HIV: NONREACTIVE

## 2022-12-22 LAB — OB RESULTS CONSOLE GBS: GBS: POSITIVE

## 2022-12-29 ENCOUNTER — Inpatient Hospital Stay
Admit: 2022-12-29 | Discharge: 2022-12-31 | DRG: 806 | Disposition: A | Discharge status: Home / Self Care | Payer: BC Managed Care – PPO | Attending: Certified Nurse Midwife | Admitting: Certified Nurse Midwife

## 2022-12-29 ENCOUNTER — Encounter: Payer: Self-pay | Admitting: Obstetrics and Gynecology

## 2022-12-29 ENCOUNTER — Inpatient Hospital Stay: Payer: BC Managed Care – PPO | Admitting: General Practice

## 2022-12-29 ENCOUNTER — Other Ambulatory Visit: Payer: Self-pay

## 2022-12-29 DIAGNOSIS — O365930 Maternal care for other known or suspected poor fetal growth, third trimester, not applicable or unspecified: Secondary | ICD-10-CM | POA: Diagnosis present

## 2022-12-29 DIAGNOSIS — O99824 Streptococcus B carrier state complicating childbirth: Secondary | ICD-10-CM | POA: Diagnosis present

## 2022-12-29 DIAGNOSIS — Z3A37 37 weeks gestation of pregnancy: Secondary | ICD-10-CM

## 2022-12-29 DIAGNOSIS — O35EXX0 Maternal care for other (suspected) fetal abnormality and damage, fetal genitourinary anomalies, not applicable or unspecified: Secondary | ICD-10-CM | POA: Diagnosis present

## 2022-12-29 DIAGNOSIS — D62 Acute posthemorrhagic anemia: Secondary | ICD-10-CM | POA: Diagnosis not present

## 2022-12-29 DIAGNOSIS — O99214 Obesity complicating childbirth: Secondary | ICD-10-CM | POA: Diagnosis present

## 2022-12-29 DIAGNOSIS — O9081 Anemia of the puerperium: Secondary | ICD-10-CM | POA: Diagnosis not present

## 2022-12-29 DIAGNOSIS — Z23 Encounter for immunization: Secondary | ICD-10-CM | POA: Diagnosis not present

## 2022-12-29 DIAGNOSIS — Z7901 Long term (current) use of anticoagulants: Secondary | ICD-10-CM

## 2022-12-29 DIAGNOSIS — D6851 Activated protein C resistance: Secondary | ICD-10-CM | POA: Diagnosis present

## 2022-12-29 DIAGNOSIS — O365990 Maternal care for other known or suspected poor fetal growth, unspecified trimester, not applicable or unspecified: Secondary | ICD-10-CM | POA: Diagnosis present

## 2022-12-29 DIAGNOSIS — Z8249 Family history of ischemic heart disease and other diseases of the circulatory system: Secondary | ICD-10-CM

## 2022-12-29 DIAGNOSIS — O9912 Other diseases of the blood and blood-forming organs and certain disorders involving the immune mechanism complicating childbirth: Secondary | ICD-10-CM | POA: Diagnosis present

## 2022-12-29 LAB — CBC
HCT: 31.6 % — ABNORMAL LOW (ref 36.0–46.0)
Hemoglobin: 10.8 g/dL — ABNORMAL LOW (ref 12.0–15.0)
MCH: 29.5 pg (ref 26.0–34.0)
MCHC: 34.2 g/dL (ref 30.0–36.0)
MCV: 86.3 fL (ref 80.0–100.0)
Platelets: 263 10*3/uL (ref 150–400)
RBC: 3.66 MIL/uL — ABNORMAL LOW (ref 3.87–5.11)
RDW: 14.5 % (ref 11.5–15.5)
WBC: 10.2 10*3/uL (ref 4.0–10.5)
nRBC: 0 % (ref 0.0–0.2)

## 2022-12-29 LAB — TYPE AND SCREEN
ABO/RH(D): A POS
Antibody Screen: NEGATIVE

## 2022-12-29 MED ORDER — EPHEDRINE 5 MG/ML INJ
10.0000 mg | INTRAVENOUS | Status: DC | PRN
  Filled 2022-12-29: qty 5

## 2022-12-29 MED ORDER — LIDOCAINE HCL (PF) 1 % IJ SOLN
INTRAMUSCULAR | Status: AC
  Filled 2022-12-29: qty 30

## 2022-12-29 MED ORDER — AMMONIA AROMATIC IN INHA
RESPIRATORY_TRACT | Status: AC
  Filled 2022-12-29: qty 10

## 2022-12-29 MED ORDER — DIPHENHYDRAMINE HCL 50 MG/ML IJ SOLN: 12.5000 mg | INTRAMUSCULAR | Status: DC | PRN

## 2022-12-29 MED ORDER — MISOPROSTOL 25 MCG QUARTER TABLET
25.0000 ug | ORAL_TABLET | ORAL | Status: DC
  Administered 2022-12-29 (×3): 25 ug via ORAL
  Filled 2022-12-29 (×4): qty 1

## 2022-12-29 MED ORDER — FENTANYL-BUPIVACAINE-NACL 0.5-0.125-0.9 MG/250ML-% EP SOLN
EPIDURAL | Status: AC
  Filled 2022-12-29: qty 250

## 2022-12-29 MED ORDER — MISOPROSTOL 200 MCG PO TABS
ORAL_TABLET | ORAL | Status: AC
  Filled 2022-12-29: qty 4

## 2022-12-29 MED ORDER — ACETAMINOPHEN 325 MG PO TABS
650.0000 mg | ORAL_TABLET | ORAL | Status: DC | PRN
  Administered 2022-12-30: 650 mg via ORAL
  Filled 2022-12-29: qty 2

## 2022-12-29 MED ORDER — LIDOCAINE-EPINEPHRINE (PF) 1.5 %-1:200000 IJ SOLN
INTRAMUSCULAR | Status: DC | PRN
  Administered 2022-12-29: 3 mL via PERINEURAL

## 2022-12-29 MED ORDER — FENTANYL CITRATE (PF) 100 MCG/2ML IJ SOLN: 50.0000 ug | INTRAMUSCULAR | Status: DC | PRN

## 2022-12-29 MED ORDER — OXYTOCIN-SODIUM CHLORIDE 30-0.9 UT/500ML-% IV SOLN
2.5000 [IU]/h | INTRAVENOUS | Status: DC
  Administered 2022-12-30: 2.5 [IU]/h via INTRAVENOUS
  Filled 2022-12-29: qty 500

## 2022-12-29 MED ORDER — OXYTOCIN 10 UNIT/ML IJ SOLN
INTRAMUSCULAR | Status: AC
  Filled 2022-12-29: qty 2

## 2022-12-29 MED ORDER — MISOPROSTOL 25 MCG QUARTER TABLET
25.0000 ug | ORAL_TABLET | ORAL | Status: DC
  Administered 2022-12-29 (×3): 25 ug via VAGINAL
  Filled 2022-12-29 (×3): qty 1

## 2022-12-29 MED ORDER — EPHEDRINE 5 MG/ML INJ
10.0000 mg | INTRAVENOUS | Status: DC | PRN
  Administered 2022-12-29: 10 mg via INTRAVENOUS

## 2022-12-29 MED ORDER — PHENYLEPHRINE 80 MCG/ML (10ML) SYRINGE FOR IV PUSH (FOR BLOOD PRESSURE SUPPORT): 80.0000 ug | PREFILLED_SYRINGE | INTRAVENOUS | Status: DC | PRN

## 2022-12-29 MED ORDER — LACTATED RINGERS IV SOLN
500.0000 mL | Freq: Once | INTRAVENOUS | Status: AC
  Administered 2022-12-29: 500 mL via INTRAVENOUS

## 2022-12-29 MED ORDER — SOD CITRATE-CITRIC ACID 500-334 MG/5ML PO SOLN: 30.0000 mL | ORAL | Status: DC | PRN

## 2022-12-29 MED ORDER — LIDOCAINE HCL (PF) 1 % IJ SOLN: 30.0000 mL | INTRAMUSCULAR | Status: DC | PRN

## 2022-12-29 MED ORDER — SODIUM CHLORIDE 0.9 % IV SOLN
INTRAVENOUS | Status: DC | PRN
  Administered 2022-12-29 (×2): 4 mL via EPIDURAL

## 2022-12-29 MED ORDER — ONDANSETRON HCL 4 MG/2ML IJ SOLN
4.0000 mg | Freq: Four times a day (QID) | INTRAMUSCULAR | Status: DC | PRN
  Administered 2022-12-29: 4 mg via INTRAVENOUS
  Filled 2022-12-29: qty 2

## 2022-12-29 MED ORDER — PENICILLIN G POT IN DEXTROSE 60000 UNIT/ML IV SOLN
3.0000 10*6.[IU] | INTRAVENOUS | Status: DC
  Filled 2022-12-29: qty 50

## 2022-12-29 MED ORDER — TERBUTALINE SULFATE 1 MG/ML IJ SOLN
0.2500 mg | Freq: Once | INTRAMUSCULAR | Status: AC | PRN
  Administered 2022-12-29: 0.25 mg via SUBCUTANEOUS
  Filled 2022-12-29: qty 1

## 2022-12-29 MED ORDER — FENTANYL-BUPIVACAINE-NACL 0.5-0.125-0.9 MG/250ML-% EP SOLN
12.0000 mL/h | EPIDURAL | Status: DC | PRN
  Administered 2022-12-29: 12 mL/h via EPIDURAL

## 2022-12-29 MED ORDER — OXYTOCIN BOLUS FROM INFUSION
333.0000 mL | Freq: Once | INTRAVENOUS | Status: AC
  Administered 2022-12-29: 333 mL via INTRAVENOUS

## 2022-12-29 MED ORDER — LACTATED RINGERS IV SOLN
500.0000 mL | INTRAVENOUS | Status: DC | PRN
  Administered 2022-12-29: 500 mL via INTRAVENOUS

## 2022-12-29 MED ORDER — SODIUM CHLORIDE 0.9 % IV SOLN
5.0000 10*6.[IU] | Freq: Once | INTRAVENOUS | Status: AC
  Administered 2022-12-29: 5 10*6.[IU] via INTRAVENOUS
  Filled 2022-12-29: qty 5

## 2022-12-29 MED ORDER — LACTATED RINGERS IV SOLN: INTRAVENOUS | Status: DC

## 2022-12-29 NOTE — Anesthesia Preprocedure Evaluation (Signed)
Anesthesia Evaluation  Patient identified by MRN, date of birth, ID band Patient awake    Reviewed: Allergy & Precautions, NPO status , Patient's Chart, lab work & pertinent test results  Airway Mallampati: III  TM Distance: >3 FB Neck ROM: full    Dental  (+) Chipped   Pulmonary neg pulmonary ROS   Pulmonary exam normal        Cardiovascular Exercise Tolerance: Good negative cardio ROS Normal cardiovascular exam     Neuro/Psych    GI/Hepatic negative GI ROS,,,  Endo/Other  diabetes    Renal/GU   negative genitourinary   Musculoskeletal   Abdominal   Peds  Hematology  (+) Blood dyscrasia   Anesthesia Other Findings Past Medical History: No date: DVT (deep venous thrombosis) (HCC) No date: Factor V Leiden (HCC)     Comment:  heterozygous No date: Gestational diabetes No date: Heart murmur  Past Surgical History: No date: NO PAST SURGERIES  BMI    Body Mass Index: 37.69 kg/m      Reproductive/Obstetrics (+) Pregnancy                             Anesthesia Physical Anesthesia Plan  ASA: 2  Anesthesia Plan: Epidural   Post-op Pain Management:    Induction:   PONV Risk Score and Plan: 2  Airway Management Planned: Natural Airway  Additional Equipment:   Intra-op Plan:   Post-operative Plan:   Informed Consent: I have reviewed the patients History and Physical, chart, labs and discussed the procedure including the risks, benefits and alternatives for the proposed anesthesia with the patient or authorized representative who has indicated his/her understanding and acceptance.     Dental Advisory Given  Plan Discussed with: Anesthesiologist  Anesthesia Plan Comments: (Patient reports no bleeding problems and no anticoagulant use.   Patient consented for risks of anesthesia including but not limited to:  - adverse reactions to medications - risk of bleeding,  infection and or nerve damage from epidural that could lead to paralysis - risk of headache or failed epidural - nerve damage due to positioning - that if epidural is used for C-section that there is a chance of epidural failure requiring spinal placement or conversion to GA - Damage to heart, brain, lungs, other parts of body or loss of life  Patient voiced understanding.)       Anesthesia Quick Evaluation

## 2022-12-29 NOTE — H&P (Signed)
OB History & Physical   History of Present Illness:  Chief Complaint:   HPI:  Hannah Patterson is a 32 y.o. G17P2002 female at [redacted]w[redacted]d dated by LMP.  She presents to L&D for IUGR.  She reports:  -active fetal movement -no leakage of fluid -no vaginal bleeding -no contractions  Pregnancy Issues: Factor V Leiden mutation with DVT, on ppx Lovenox 60mg  daily Obesity Anemia Fetal pelviectasis= 1.05cm on left Rubella NON-immune Hx A1 Gestational diabetes x 2 Chronic Uveitis of right eye  Maternal Medical History:   Past Medical History:  Diagnosis Date   DVT (deep venous thrombosis) (HCC)    Factor V Leiden (HCC)    heterozygous   Gestational diabetes    Heart murmur     No past surgical history on file.  Allergies  Allergen Reactions   Cat Hair Extract Swelling    Prior to Admission medications   Medication Sig Start Date End Date Taking? Authorizing Provider  valACYclovir (VALTREX) 500 MG tablet Take 1 tablet (500 mg total) by mouth 2 (two) times daily. 06/28/20   McVey, Prudencio Pair, CNM     Prenatal care site: [facility]    Social History: She  reports that she has never smoked. She has never used smokeless tobacco. She reports that she does not drink alcohol and does not use drugs.  Family History: family history includes Alcohol abuse in her father; Cancer in her maternal aunt; Diabetes in her father; Hypertension in her mother; Miscarriages / Stillbirths in her paternal grandmother; Pulmonary embolism in her maternal grandmother.   Review of Systems: A full review of systems was performed and negative except as noted in the HPI.    Physical Exam:  Vital Signs: There were no vitals taken for this visit.  General:   alert and cooperative  Skin:  normal  Neurologic:    Alert & oriented x 3  Lungs:    Nl effort  Heart:   regular rate and rhythm  Abdomen:  soft, non-tender; bowel sounds normal; no masses,  no organomegaly  Extremities: : non-tender,  symmetric, no edema bilaterally.      EFW: 12/22/22 2156g (4lb12oz) at 3rd%ile   Pertinent Results:  Prenatal Labs: Blood type/Rh A pos  Antibody screen neg  Rubella Non-Immune  Varicella Immune  RPR NR  HBsAg Neg  HIV NR  GC neg  Chlamydia neg  Genetic screening negative  1 hour GTT   3 hour GTT   GBS Pos   FHT: FHR: 145 bpm, variability: moderate,  accelerations:  Present,  decelerations:  Absent Category/reactivity:  Category I TOCO: occasional mild SVE: Dilation: 1 / Effacement (%): Thick / Station: -3     Assessment:  Hannah Patterson is a 32 y.o. [id number] female at [redacted]w[redacted]d with IUGR.   Plan:  1. Admit to Labor & Delivery; consents reviewed and obtained  2. Fetal Well being  - Fetal Tracing: cat I - GBS pos - Presentation: vtx confirmed by sve   3. Routine OB: - Prenatal labs reviewed, as above - Rh pos - CBC & T&S on admit - Clear fluids, IVF  4. Induction of Labor -  Contractions by external toco in place -  Pelvis proven to 2870g -  Plan for induction with Cytotec -  Plan for continuous fetal monitoring  -  Maternal pain control as desired: IVPM, nitrous, regional anesthesia - Anticipate vaginal delivery  5. Post Partum Planning: - Infant feeding: Breastfeeding - Contraception: Vasectomy  -  Tdap: given 10/19/22   Haroldine Laws, CNM 12/29/2022 8:28 AM

## 2022-12-29 NOTE — Progress Notes (Signed)
Labor Progress Note  Hannah Patterson Schools Demetriou is a 32 y.o. N5A2130 at [redacted]w[redacted]d by LMP admitted for induction of labor due to IUGR.  Subjective: Comfortable, family with her and supportive  Objective: BP 107/76 (BP Location: Right Arm)   Pulse 95   Temp 98.4 F (36.9 C) (Oral)   Resp 16   Ht 5' (1.524 m)   Wt 87.5 kg   BMI 37.69 kg/m    Fetal Assessment: FHT:  FHR: 145 bpm, variability: moderate,  accelerations:  Present,  decelerations:  Absent Category/reactivity:  Category I UC:   Occasional and mild SVE:    Dilation: 1cm  Effacement: Long  Station:  -3  Consistency: firm  Position: posterior  Membrane status: Intact Amniotic color: n/a  Labs: Lab Results  Component Value Date   WBC 10.2 12/29/2022   HGB 10.8 (L) 12/29/2022   HCT 31.6 (L) 12/29/2022   MCV 86.3 12/29/2022   PLT 263 12/29/2022    Assessment / Plan: Induction of labor due to IUGR 0944 Cytotec given PO and PV  Labor: Progressing normally Preeclampsia:   107/76 Fetal Wellbeing:  Category I Pain Control:  Labor support without medications I/D:   Afebrile, GBS pos - abx not yet started, Intact  Anticipated MOD:  NSVD  Cyril Mourning, CNM 12/29/2022, 9:53 AM

## 2022-12-29 NOTE — Anesthesia Procedure Notes (Signed)
Epidural Patient location during procedure: OB Start time: 12/29/2022 9:13 PM End time: 12/29/2022 9:26 PM  Staffing Anesthesiologist: Stephanie Coup, MD Performed: anesthesiologist   Preanesthetic Checklist Completed: patient identified, IV checked, site marked, risks and benefits discussed, surgical consent, monitors and equipment checked, pre-op evaluation and timeout performed  Epidural Patient position: sitting Prep: Betadine Patient monitoring: heart rate, continuous pulse ox and blood pressure Approach: midline Location: L3-L4 Injection technique: LOR saline  Needle:  Needle type: Tuohy  Needle gauge: 18 G Needle length: 9 cm and 9 Needle insertion depth: 6 cm Catheter type: closed end flexible Catheter size: 20 Guage Catheter at skin depth: 11 cm Test dose: negative and 1.5% lidocaine with Epi 1:200 K  Assessment Sensory level: T4 Events: blood not aspirated, no cerebrospinal fluid, injection not painful, no injection resistance, no paresthesia and negative IV test  Additional Notes 1 attempt Pt. Evaluated and documentation done after procedure finished. Patient identified. Risks/Benefits/Options discussed with patient including but not limited to bleeding, infection, nerve damage, paralysis, failed block, incomplete pain control, headache, blood pressure changes, nausea, vomiting, reactions to medication both or allergic, itching and postpartum back pain. Confirmed with bedside nurse the patient's most recent platelet count. Confirmed with patient that they are not currently taking any anticoagulation, have any bleeding history or any family history of bleeding disorders. Patient expressed understanding and wished to proceed. All questions were answered. Sterile technique was used throughout the entire procedure. Please see nursing notes for vital signs. Test dose was given through epidural catheter and negative prior to continuing to dose epidural or start infusion.  Warning signs of high block given to the patient including shortness of breath, tingling/numbness in hands, complete motor block, or any concerning symptoms with instructions to call for help. Patient was given instructions on fall risk and not to get out of bed. All questions and concerns addressed with instructions to call with any issues or inadequate analgesia.    Patient tolerated the insertion well without immediate complications. Reason for block:procedure for pain

## 2022-12-29 NOTE — Progress Notes (Signed)
Labor Progress Note  Hannah Patterson is a 32 y.o. [id number] at [redacted]w[redacted]d by LMP admitted for induction of labor due to IUGR.  Subjective: Pt reports some cramping in her back, and some round ligament pain  Objective: BP 113/69 (BP Location: Left Arm)   Pulse 79   Temp 98.3 F (36.8 C) (Oral)   Resp 16   Ht 5' (1.524 m)   Wt 87.5 kg   BMI 37.69 kg/m    Fetal Assessment: FHT:  FHR: 140 bpm, variability: moderate,  accelerations:  Present,  decelerations:  Absent Category/reactivity:  Category I UC:   regular, every 1-4 min SVE:    Dilation: 1cm  Effacement: Long  Station:  -3  Consistency: firm  Position: posterior  Membrane status: Intact Amniotic color: n/a  Labs: Lab Results  Component Value Date   WBC 10.2 12/29/2022   HGB 10.8 (L) 12/29/2022   HCT 31.6 (L) 12/29/2022   MCV 86.3 12/29/2022   PLT 263 12/29/2022    Assessment / Plan: Induction of labor due to IUGR 0944 Cytotec given PO and PV 1407 Cytotec given PO and PV  Labor: Progressing normally Preeclampsia:   113/69 Fetal Wellbeing:  Category I Pain Control:  Labor support without medications I/D:   Afebrile, GBS pos - abx not yet started, Intact  Anticipated MOD:  NSVD  Cyril Mourning, CNM 12/29/2022, 6:43 PM

## 2022-12-29 NOTE — Progress Notes (Signed)
Labor Progress Note  Hannah Patterson is a 32 y.o. S9H7342 at [redacted]w[redacted]d by LMP admitted for induction of labor due to IUGR.  Subjective: Pt reports some cramping in her back, and some round ligament pain  Objective: BP 113/69 (BP Location: Left Arm)   Pulse 79   Temp 98.3 F (36.8 C) (Oral)   Resp 16   Ht 5' (1.524 m)   Wt 87.5 kg   BMI 37.69 kg/m    Fetal Assessment: FHT:  FHR: 150 bpm, variability: moderate,  accelerations:  Present,  decelerations:  Absent Category/reactivity:  Category I UC:   regular, every 1-4 min SVE:    Dilation: 1cm  Effacement: Long  Station:  -3  Consistency: firm  Position: posterior  Membrane status: Intact Amniotic color: n/a  Labs: Lab Results  Component Value Date   WBC 10.2 12/29/2022   HGB 10.8 (L) 12/29/2022   HCT 31.6 (L) 12/29/2022   MCV 86.3 12/29/2022   PLT 263 12/29/2022    Assessment / Plan: Induction of labor due to IUGR 0944 Cytotec given PO and PV 1407 Cytotec given PO and PV 1828 Cytotec given PO and PV  Labor: Progressing normally Preeclampsia:   113/69 Fetal Wellbeing:  Category I Pain Control:  Labor support without medications I/D:   Afebrile, GBS pos - abx not yet started, Intact  Anticipated MOD:  NSVD  Cyril Mourning, CNM 12/29/2022, 6:48 PM

## 2022-12-29 NOTE — Progress Notes (Signed)
Labor Progress Note  Hannah Patterson is a 32 y.o. [id number] at [redacted]w[redacted]d by LMP admitted for induction of labor due to IUGR.  Subjective: Called to BS for decelerations. Pt just got her epidural and is comfortable.  Objective: BP 119/64   Pulse 85   Temp 97.7 F (36.5 C) (Axillary)   Resp 16   Ht 5' (1.524 m)   Wt 87.5 kg   SpO2 98%   BMI 37.69 kg/m    Fetal Assessment: FHT:  FHR: 155 bpm, variability: moderate,  accelerations:  Present,  decelerations:  Absent Category/reactivity:  Category I UC:   regular, every 1-2 min SVE:    Dilation: 3.5cm  Effacement: 50%  Station:  -2  Consistency: firm  Position: posterior  Membrane status: Intact Amniotic color: n/a  Labs: Lab Results  Component Value Date   WBC 10.2 12/29/2022   HGB 10.8 (L) 12/29/2022   HCT 31.6 (L) 12/29/2022   MCV 86.3 12/29/2022   PLT 263 12/29/2022    Assessment / Plan: Induction of labor due to IUGR 0944 Cytotec given PO and PV 1407 Cytotec given PO and PV 1828 Cytotec given PO and PV Epidural  Labor: Progressing normally Preeclampsia:   125/74 Fetal Wellbeing:  Category I Pain Control:  Epidural I/D:   Afebrile, GBS pos - abx starting now, Intact  Anticipated MOD:  NSVD  Cyril Mourning, CNM 12/29/2022, 10:22 PM

## 2022-12-30 ENCOUNTER — Encounter: Payer: Self-pay | Admitting: Obstetrics and Gynecology

## 2022-12-30 LAB — CBC
HCT: 30.4 % — ABNORMAL LOW (ref 36.0–46.0)
Hemoglobin: 10.1 g/dL — ABNORMAL LOW (ref 12.0–15.0)
MCH: 28.9 pg (ref 26.0–34.0)
MCHC: 33.2 g/dL (ref 30.0–36.0)
MCV: 86.9 fL (ref 80.0–100.0)
Platelets: 239 10*3/uL (ref 150–400)
RBC: 3.5 MIL/uL — ABNORMAL LOW (ref 3.87–5.11)
RDW: 14.6 % (ref 11.5–15.5)
WBC: 14.5 10*3/uL — ABNORMAL HIGH (ref 4.0–10.5)
nRBC: 0 % (ref 0.0–0.2)

## 2022-12-30 LAB — CREATININE, SERUM
Creatinine, Ser: 0.42 mg/dL — ABNORMAL LOW (ref 0.44–1.00)
GFR, Estimated: >60 mL/min (ref >60)

## 2022-12-30 MED ORDER — PRENATAL MULTIVITAMIN CH
1.0000 | ORAL_TABLET | Freq: Every day | ORAL | Status: DC
  Administered 2022-12-30 – 2022-12-31 (×2): 1 via ORAL
  Filled 2022-12-30 (×2): qty 1

## 2022-12-30 MED ORDER — COCONUT OIL OIL
1.0000 | TOPICAL_OIL | Status: DC | PRN
  Administered 2022-12-31: 1 via TOPICAL
  Filled 2022-12-30: qty 7.5

## 2022-12-30 MED ORDER — MEASLES, MUMPS & RUBELLA VAC IJ SOLR
0.5000 mL | INTRAMUSCULAR | Status: AC | PRN
  Administered 2022-12-31: 0.5 mL via SUBCUTANEOUS
  Filled 2022-12-30: qty 0.5

## 2022-12-30 MED ORDER — DIBUCAINE (PERIANAL) 1 % EX OINT
1.0000 | TOPICAL_OINTMENT | CUTANEOUS | Status: DC | PRN
  Administered 2022-12-31: 1 via RECTAL
  Filled 2022-12-30: qty 28

## 2022-12-30 MED ORDER — ACETAMINOPHEN 500 MG PO TABS
1000.0000 mg | ORAL_TABLET | Freq: Four times a day (QID) | ORAL | Status: DC
  Administered 2022-12-30 – 2022-12-31 (×5): 1000 mg via ORAL
  Filled 2022-12-30 (×5): qty 2

## 2022-12-30 MED ORDER — ONDANSETRON HCL 4 MG PO TABS: 4.0000 mg | ORAL_TABLET | ORAL | Status: DC | PRN

## 2022-12-30 MED ORDER — WITCH HAZEL-GLYCERIN EX PADS
1.0000 | MEDICATED_PAD | CUTANEOUS | Status: DC | PRN
  Administered 2022-12-31: 1 via TOPICAL
  Filled 2022-12-30: qty 100

## 2022-12-30 MED ORDER — ONDANSETRON HCL 4 MG/2ML IJ SOLN: 4.0000 mg | INTRAMUSCULAR | Status: DC | PRN

## 2022-12-30 MED ORDER — FERROUS SULFATE 325 (65 FE) MG PO TABS
325.0000 mg | ORAL_TABLET | Freq: Two times a day (BID) | ORAL | Status: DC
  Administered 2022-12-30 – 2022-12-31 (×3): 325 mg via ORAL
  Filled 2022-12-30 (×3): qty 1

## 2022-12-30 MED ORDER — BENZOCAINE-MENTHOL 20-0.5 % EX AERO: 1.0000 | INHALATION_SPRAY | CUTANEOUS | Status: DC | PRN

## 2022-12-30 MED ORDER — ENOXAPARIN SODIUM 60 MG/0.6ML IJ SOSY
60.0000 mg | PREFILLED_SYRINGE | INTRAMUSCULAR | Status: DC
  Administered 2022-12-30: 60 mg via SUBCUTANEOUS
  Filled 2022-12-30: qty 0.6

## 2022-12-30 MED ORDER — IBUPROFEN 600 MG PO TABS
600.0000 mg | ORAL_TABLET | Freq: Four times a day (QID) | ORAL | Status: DC
  Filled 2022-12-30 (×2): qty 1

## 2022-12-30 MED ORDER — ACETAMINOPHEN 325 MG PO TABS: 650.0000 mg | ORAL_TABLET | ORAL | Status: DC | PRN

## 2022-12-30 MED ORDER — SENNOSIDES-DOCUSATE SODIUM 8.6-50 MG PO TABS
2.0000 | ORAL_TABLET | Freq: Every day | ORAL | Status: DC
  Administered 2022-12-31: 2 via ORAL
  Filled 2022-12-30: qty 2

## 2022-12-30 MED ORDER — OXYCODONE HCL 5 MG PO TABS: 5.0000 mg | ORAL_TABLET | ORAL | Status: DC | PRN

## 2022-12-30 MED ORDER — OXYCODONE HCL 5 MG PO TABS: 10.0000 mg | ORAL_TABLET | ORAL | Status: DC | PRN

## 2022-12-30 MED ORDER — DIPHENHYDRAMINE HCL 25 MG PO CAPS: 25.0000 mg | ORAL_CAPSULE | Freq: Four times a day (QID) | ORAL | Status: DC | PRN

## 2022-12-30 MED ORDER — SIMETHICONE 80 MG PO CHEW: 80.0000 mg | CHEWABLE_TABLET | ORAL | Status: DC | PRN

## 2022-12-30 MED ORDER — TETANUS-DIPHTH-ACELL PERTUSSIS 5-2.5-18.5 LF-MCG/0.5 IM SUSY
0.5000 mL | PREFILLED_SYRINGE | Freq: Once | INTRAMUSCULAR | Status: DC
  Filled 2022-12-30 (×2): qty 0.5

## 2022-12-30 NOTE — Anesthesia Postprocedure Evaluation (Signed)
Anesthesia Post Note  Patient: [name], quantity  Procedure(s) Performed: AN AD HOC LABOR EPIDURAL  Patient location during evaluation: Mother Baby Anesthesia Type: Epidural Level of consciousness: oriented and awake and alert Pain management: pain level controlled Vital Signs Assessment: post-procedure vital signs reviewed and stable Respiratory status: spontaneous breathing and respiratory function stable Cardiovascular status: blood pressure returned to baseline and stable Postop Assessment: no headache, no backache, no apparent nausea or vomiting and able to ambulate Anesthetic complications: no   No notable events documented.   Last Vitals:  Vitals:   12/30/22 0640 12/30/22 0703  BP: 135/81 (!) 103/53  Pulse: 72 (!) 56  Resp:  15  Temp: 36.4 C 36.7 C  SpO2:      Last Pain:  Vitals:   12/30/22 0703  TempSrc: Oral  PainSc:                  Starling Manns

## 2022-12-30 NOTE — Progress Notes (Signed)
2307 Dr. Feliberto Gottron called and notified of FHT - he requested internals 2318 AROM and FSE/IUPC placed 2339 10/100/+3 2344 Delivered  Jenifer E Yidel Teuscher 12/30/2022 7:15 AM

## 2022-12-30 NOTE — Progress Notes (Signed)
Postpartum Day  1  Subjective: 32 y.o. [id number] postpartum day #1 status post normal spontaneous vaginal delivery. She is ambulating, is tolerating po, is voiding spontaneously.  Her pain is well controlled on PO pain medications. Her lochia is less than menses.  Objective: BP (!) 103/53   Pulse (!) 56   Temp 98.1 F (36.7 C) (Oral)   Resp 15   Ht 5' (1.524 m)   Wt 87.5 kg   SpO2 99%   Breastfeeding Unknown   BMI 37.69 kg/m    Physical Exam:  General: alert, cooperative, and appears stated age Breasts: soft/nontender Pulm: nl effort Abdomen: soft, non-tender, active bowel sounds Uterine Fundus: firm Perineum: minimal edema, intact Lochia: appropriate DVT Evaluation: No evidence of DVT seen on physical exam. Negative Homan's sign. No cords or calf tenderness. No significant calf/ankle edema.  Recent Labs    12/29/22 0932  HGB 10.8*  HCT 31.6*  WBC 10.2  PLT 263    Assessment/Plan: 31 y.o. [id number] postpartum day # 1  1. Continue routine postpartum care  2. Infant feeding status: breast feeding --Lactation consult PRN for breastfeeding   3. Contraception plan: vasectomy  4. Acute blood loss anemia - clinically significant.  --Hemodynamically stable and asymptomatic --Intervention: start on oral supplementation with ferrous sulfate 325 mg   5. Immunization status:   all immunizations up to date    Disposition: continue inpatient postpartum care , plan for discharge home tomorrow    LOS: 1 day   [place] Karl Wonda Amis, CNM 12/30/2022, 8:23 AM   ----- Chari Manning Certified Nurse Midwife [facility]

## 2022-12-30 NOTE — Lactation Note (Addendum)
This note was copied from a baby's chart. Lactation Consultation Note  Patient Name: Girl Danie Wittner Today's Date: 12/30/2022 Age:31 hours Reason for consult: Initial assessment;Early term 37-38.6wks   Maternal Data Has patient been taught Hand Expression?: Yes Does the patient have breastfeeding experience prior to this delivery?: Yes How long did the patient breastfeed?: 2 yrs  Feeding Mother's Current Feeding Choice: Breast Milk Baby displaying feeding cues in crib, diaper changed and baby given to mom who latched baby to left breast in cradle hold easily, baby began short burst of nursing and swallows heard but then became sleepy needing stim to continue nursing, had just nursed approx 1 hr ago, mom knows to feed baby frequently and if poor feeds, may need to pump and supp EBM.  Her other 2 children have been small babies also.   LATCH Score Latch: Grasps breast easily, tongue down, lips flanged, rhythmical sucking.  Audible Swallowing: Spontaneous and intermittent  Type of Nipple: Everted at rest and after stimulation  Comfort (Breast/Nipple): Soft / non-tender  Hold (Positioning): No assistance needed to correctly position infant at breast.  LATCH Score: 10   Lactation Tools Discussed/Used   LC name and no written on white board  Interventions Interventions: Breast feeding basics reviewed;Hand express;Education  Discharge Pump: Advised to call insurance company [facility] Program: No Has a 32yr old Spectra at home Consult Status Consult Status: PRN    Dyann Kief 12/30/2022, 1:40 PM

## 2022-12-30 NOTE — Discharge Summary (Signed)
Obstetrical Discharge Summary  Patient Name: Hannah Patterson DOB: [birth year] MRN: [id number]  Date of Admission: 12/29/2022 Date of Delivery: 12/29/22 Delivered by: Haroldine Laws, CNM  Date of Discharge: 12/31/2022  Primary OB: [facility] LMP:No LMP recorded. EDC Estimated Date of Delivery: 01/17/23 Gestational Age at Delivery: [redacted]w[redacted]d   Antepartum complications:  Factor V Leiden mutation with DVT, on ppx Lovenox 60mg  daily Obesity Anemia Fetal pelviectasis= 1.05cm on left Rubella NON-immune Hx A1 Gestational diabetes x 2 Chronic Uveitis of right eye  Admitting Diagnosis: IUGR (intrauterine growth restriction) affecting care of mother [O36.5990]  Secondary Diagnosis: Patient Active Problem List   Diagnosis Date Noted   NSVD (normal spontaneous vaginal delivery) 12/30/2022   IUGR (intrauterine growth restriction) affecting care of mother 12/29/2022   Supervision of high risk pregnancy in third trimester 06/22/2022   Factor V Leiden mutation complicating pregnancy (HCC) 06/27/2020   Uterine contractions during pregnancy 05/15/2020   Obesity (BMI 30-39.9) 12/02/2017   Labor and delivery, indication for care 12/01/2017   DVT (deep venous thrombosis) (HCC)    Factor V Leiden (HCC) 10/07/2016   Acute deep vein thrombosis (DVT) of popliteal vein of left lower extremity (HCC) 08/17/2016    Discharge Diagnosis: Term Pregnancy Delivered and IUGR       Induction: AROM and Cytotec Complications: None Intrapartum complications/course: see delivery note Delivery Type: spontaneous vaginal delivery Anesthesia: epidural anesthesia Placenta: spontaneous To Pathology: Yes  Laceration: none Episiotomy: none Newborn Data: Live born female  Birth Weight: 5 lb 14.5 oz (2680 g) APGAR: 8, 9  Newborn Delivery   Birth date/time: 12/29/2022 23:44:00 Delivery type: Vaginal, Spontaneous     Postpartum Procedures: none Edinburgh:     12/30/2022   10:00 AM 06/28/2020    9:30 AM  06/27/2020    9:00 PM  Edinburgh Postnatal Depression Scale Screening Tool  I have been able to laugh and see the funny side of things. 0 0 --  I have looked forward with enjoyment to things. 0 0   I have blamed myself unnecessarily when things went wrong. 0 1   I have been anxious or worried for no good reason. 0 0   I have felt scared or panicky for no good reason. 0 0   Things have been getting on top of me. 0 0   I have been so unhappy that I have had difficulty sleeping. 0 0   I have felt sad or miserable. 0 0   I have been so unhappy that I have been crying. 0 0   The thought of harming myself has occurred to me. 0 0   Edinburgh Postnatal Depression Scale Total 0 1      Post partum course:   Patient had an uncomplicated postpartum course.  By time of discharge on PPD#2, her pain was controlled on oral pain medications; she had appropriate lochia and was ambulating, voiding without difficulty and tolerating regular diet.  She was deemed stable for discharge to home.    Discharge Physical Exam:   BP 106/71 (BP Location: Right Arm)   Pulse 67   Temp 97.6 F (36.4 C) (Oral)   Resp 18   Ht 5' (1.524 m)   Wt 87.5 kg   SpO2 97%   Breastfeeding Unknown   BMI 37.69 kg/m   General: NAD CV: RRR Pulm: CTABL, nl effort ABD: s/nd/nt, fundus firm and below the umbilicus Lochia: moderate Perineum: minimal edema/intact Incision: n/a DVT Evaluation: LE non-ttp, no evidence of DVT  on exam.  Hemoglobin  Date Value Ref Range Status  12/30/2022 10.1 (L) 12.0 - 15.0 g/dL Final   HCT  Date Value Ref Range Status  12/30/2022 30.4 (L) 36.0 - 46.0 % Final    Risk assessment for postpartum VTE and prophylactic treatment: Very high risk factors: On anticoagulation treatment or prophylaxis antepartum  and Previous VTE High risk factors: None Moderate risk factors: IUGR < 10th percentile (confirmed by birthweight)  and BMI 30-40 kg/m2  Postpartum VTE prophylaxis with LMWH ordered  for  6wks  Disposition: stable, discharge to home. Baby Feeding: breast feeding Baby Disposition: home with mom  Rh Immune globulin indicated: No Rubella vaccine given: was offered Varivax vaccine given: was not indicated Flu vaccine given in AP setting: not in season Tdap vaccine given in AP setting: Yes   Contraception: vasectomy  Prenatal Labs:  Blood type/Rh A pos  Antibody screen neg  Rubella Non-Immune  Varicella Immune  RPR NR  HBsAg Neg  HIV NR  GC neg  Chlamydia neg  Genetic screening negative  1 hour GTT    3 hour GTT    GBS Pos    Plan:  Day Robyn Behrend was discharged to home in good condition.  Discharge Medications: Allergies as of 12/31/2022       Reactions   Cat Hair Extract Swelling        Medication List     STOP taking these medications    ferrous sulfate 324 MG Tbec Replaced by: ferrous sulfate 325 (65 FE) MG tablet   valACYclovir 500 MG tablet Commonly known as: VALTREX       TAKE these medications    acetaminophen 500 MG tablet Commonly known as: TYLENOL Take 2 tablets (1,000 mg total) by mouth every 6 (six) hours as needed for fever or mild pain.   benzocaine-Menthol 20-0.5 % Aero Commonly known as: DERMOPLAST Apply 1 Application topically as needed for irritation (perineal discomfort).   dibucaine 1 % Oint Commonly known as: NUPERCAINAL Place 1 Application rectally as needed for hemorrhoids.   enoxaparin 60 MG/0.6ML injection Commonly known as: LOVENOX Inject 0.6 mLs (60 mg total) into the skin daily. What changed: medication strength   ferrous sulfate 325 (65 FE) MG tablet Take 1 tablet (325 mg total) by mouth 2 (two) times daily with a meal. Replaces: ferrous sulfate 324 MG Tbec   ibuprofen 600 MG tablet Commonly known as: ADVIL Take 1 tablet (600 mg total) by mouth every 6 (six) hours as needed for fever, cramping or mild pain.   prenatal multivitamin Tabs tablet Take 1 tablet by mouth daily at 12 noon.    senna-docusate 8.6-50 MG tablet Commonly known as: Senokot-S Take 2 tablets by mouth at bedtime as needed for mild constipation.   witch hazel-glycerin pad Commonly known as: TUCKS Apply 1 Application topically as needed for hemorrhoids.         Follow-up Information     Haroldine Laws, CNM. Schedule an appointment as soon as possible for a visit in 6 week(s).   Specialty: Certified Nurse Midwife Contact information: [address] [place] [address] [phone]                 Signed:  Blanchard Kelch 12/31/2022 10:59 AM

## 2022-12-31 MED ORDER — FERROUS SULFATE 325 (65 FE) MG PO TABS: 325.0000 mg | ORAL_TABLET | Freq: Two times a day (BID) | ORAL | 3 refills | Status: AC

## 2022-12-31 MED ORDER — BENZOCAINE-MENTHOL 20-0.5 % EX AERO: 1.0000 | INHALATION_SPRAY | CUTANEOUS | 0 refills | Status: AC | PRN

## 2022-12-31 MED ORDER — DIBUCAINE (PERIANAL) 1 % EX OINT: 1.0000 | TOPICAL_OINTMENT | CUTANEOUS | 0 refills | Status: AC | PRN

## 2022-12-31 MED ORDER — ENOXAPARIN SODIUM 60 MG/0.6ML IJ SOSY: 60.0000 mg | PREFILLED_SYRINGE | INTRAMUSCULAR | 0 refills | Status: AC

## 2022-12-31 MED ORDER — SENNOSIDES-DOCUSATE SODIUM 8.6-50 MG PO TABS: 2.0000 | ORAL_TABLET | Freq: Every evening | ORAL | 0 refills | Status: AC | PRN

## 2022-12-31 MED ORDER — ACETAMINOPHEN 500 MG PO TABS: 1000.0000 mg | ORAL_TABLET | Freq: Four times a day (QID) | ORAL | 0 refills | Status: AC | PRN

## 2022-12-31 MED ORDER — IBUPROFEN 600 MG PO TABS: 600.0000 mg | ORAL_TABLET | Freq: Four times a day (QID) | ORAL | 0 refills | Status: AC | PRN

## 2022-12-31 MED ORDER — WITCH HAZEL-GLYCERIN EX PADS: 1.0000 | MEDICATED_PAD | CUTANEOUS | 0 refills | Status: AC | PRN

## 2022-12-31 NOTE — Lactation Note (Signed)
This note was copied from a baby's chart. Lactation Consultation Note  Patient Name: [name] Today's Date: 12/31/2022 Age:31 hours Reason for consult: Follow-up assessment;Early term 37-38.6wks   Maternal Data On follow-up visit mom reports baby has been latching and breastfeeding well. This last feed per mom baby only fed for 5 minutes post baby returning from an ultrasound . Per mom baby she attempted to feed baby longer but baby would not continue. Assisted mom with strategies to wake the sleepy baby.  Has patient been taught Hand Expression?: Yes Does the patient have breastfeeding experience prior to this delivery?: Yes How long did the patient breastfeed?: 2 years  Feeding Mother's Current Feeding Choice: Breast Milk Baby did awaken. Mom independently latched baby in cross cradle at t5he left breast.Baby fed well for 15 minutes with multiple audible swallows. LATCH Score Latch: Grasps breast easily, tongue down, lips flanged, rhythmical sucking.  Audible Swallowing: Spontaneous and intermittent  Type of Nipple: Everted at rest and after stimulation  Comfort (Breast/Nipple): Soft / non-tender  Hold (Positioning): No assistance needed to correctly position infant at breast.  LATCH Score: 10    Interventions Interventions: Breast feeding basics reviewed;Breast compression;Breast massage;Support pillows;Education Provided tips and strategies on waking the sleepy baby and maximizing latch technique to achieve a deeper latch. Discussed potential for inconsistent feeding related to early term birth. Recommended to mom if baby refuses to latch and feed once home despite tips and strategies provided mom can express breastmilk with her DEBP/manual pump and provide baby feeding with a slow flow bottle. Discussed if baby refuses 2 feeds in a row to call baby's Pediatrician.  Discharge Discharge Education: Engorgement and breast care;Warning signs for feeding baby;Outpatient  recommendation Pump: Personal  Consult Status Consult Status: Complete  Update provided to care nurse.  Fuller Song 12/31/2022, 3:21 PM

## 2022-12-31 NOTE — Progress Notes (Signed)
Patient discharged home with family.  Discharge instructions, when to follow up, and prescriptions reviewed with patient.  Patient verbalized understanding. Patient will be escorted out by auxiliary.   

## 2023-01-04 ENCOUNTER — Telehealth: Payer: Self-pay

## 2023-01-04 NOTE — Telephone Encounter (Signed)
[facility]- Discharge Call Backs-Left Voicemail about the following below. 1-Do you have any questions or concerns about yourself as you heal? 2-Any concerns or questions about your baby? 3-How was your stay at the hospital? 4- Did our team work together to care for you? You should be receiving a survey in the mail soon.   We would really appreciate it if you could fill that out for us and return it in the mail.  We value the feedback to make improvements and continue the great work we do.   If you have any questions please feel free to call me back at [phone]  

## 2024-01-16 ENCOUNTER — Ambulatory Visit: Admission: RE | Admit: 2024-01-16 | Discharge: 2024-01-16 | Payer: Self-pay | Source: Ambulatory Visit

## 2024-01-16 VITALS — BP 117/80 | HR 71 | Temp 98.7°F | Resp 16 | Wt 186.3 lb

## 2024-01-16 DIAGNOSIS — J069 Acute upper respiratory infection, unspecified: Secondary | ICD-10-CM

## 2024-01-16 DIAGNOSIS — O3680X0 Pregnancy with inconclusive fetal viability, not applicable or unspecified: Secondary | ICD-10-CM | POA: Insufficient documentation

## 2024-01-16 DIAGNOSIS — H66002 Acute suppurative otitis media without spontaneous rupture of ear drum, left ear: Secondary | ICD-10-CM | POA: Diagnosis not present

## 2024-01-16 NOTE — ED Triage Notes (Signed)
 Pt c/o cough,sinus pressure & muffled hearing x1 wk. No OTC meds. Currently breast feeding.

## 2024-01-16 NOTE — ED Provider Notes (Signed)
 [facility]    CSN: [id number] Arrival date & time: 01/16/24  1503      History   Chief Complaint Chief Complaint  Patient presents with   Cough   Sinus Problem    HPI Hannah Patterson is a 33 y.o. female.   HPI  33 year old female with past medical history significant for factor V Leiden, DVT, gestational diabetes, heart murmur presents for evaluation of respiratory symptoms that been going on for the past week.  She reports sinus pressure and at the outset she had green nasal discharge and green sputum production when she coughs.  She reports that her sputum has become clear.  Initially she had a sore throat but that has resolved.  She denies any fever, shortness breath, or wheezing.  Today she developed decreased hearing in her left ear.  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   Supervision of high risk pregnancy in third trimester 06/22/2022   Class 2 severe obesity due to excess calories with serious comorbidity and body mass index (BMI) of 35.0 to 35.9 in adult [facility]) 08/29/2021   History of cold sores 08/29/2021   History of headache 08/29/2021   History of DVT (deep vein thrombosis) 08/26/2021   Factor V Leiden mutation complicating pregnancy (HCC) 06/27/2020   Uterine contractions during pregnancy 05/15/2020   Labor and delivery, indication for care 12/01/2017   Encounter for procreative genetic counseling 05/31/2017   DVT (deep venous thrombosis) (HCC)    Obesity (BMI 30-39.9) 08/28/2016    Heterozygous factor V Leiden mutation (HCC) 08/25/2016   Acute deep vein thrombosis (DVT) of popliteal vein of left lower extremity (HCC) 08/17/2016   Chronic anticoagulation 08/05/2016   Raynaud's phenomenon without gangrene 07/17/2015    Past Surgical History:  Procedure Laterality Date   NO PAST SURGERIES      OB History     Gravida  4   Para  4   Term  3   Preterm  0   AB  0   Living  3      SAB  0   IAB  0   Ectopic  0   Multiple  0   Live Births  3            Home Medications    Prior to Admission medications   Medication Sig Start Date End Date Taking? Authorizing Provider  acetaminophen  (TYLENOL ) 500 MG tablet Take 2 tablets (1,000 mg total) by mouth every 6 (six) hours as needed for fever or mild pain. 12/31/22   Tanda Edsel Fuller, CNM  benzocaine -Menthol  (DERMOPLAST) 20-0.5 % AERO Apply 1 Application topically as needed for irritation (perineal discomfort). 12/31/22   Tanda Edsel Fuller, CNM  dibucaine (NUPERCAINAL) 1 % OINT Place 1 Application rectally as needed for hemorrhoids. 12/31/22   Tanda Edsel Fuller, CNM  enoxaparin  (LOVENOX ) 60 MG/0.6ML injection Inject 0.6 mLs (60 mg total) into the skin daily. 12/31/22 02/11/23  Tanda Edsel Fuller, CNM  ferrous sulfate  325 (65 FE) MG tablet Take  1 tablet (325 mg total) by mouth 2 (two) times daily with a meal. 12/31/22   Wilson, Edsel Fuller, CNM  ibuprofen  (ADVIL ) 600 MG tablet Take 1 tablet (600 mg total) by mouth every 6 (six) hours as needed for fever, cramping or mild pain. 12/31/22   Tanda Edsel Fuller, CNM  Prenatal Vit-Fe Fumarate-FA (PRENATAL MULTIVITAMIN) TABS tablet Take 1 tablet by mouth daily at 12 noon.    [provider]  senna-docusate (SENOKOT-S) 8.6-50 MG tablet Take 2 tablets by mouth at bedtime as needed for mild constipation. 12/31/22   Tanda Edsel Fuller, CNM  witch hazel-glycerin  (TUCKS) pad Apply 1 Application topically as needed for hemorrhoids. 12/31/22    Tanda Edsel Fuller, CNM    Family History Family History  Problem Relation Age of Onset   Cancer Maternal Aunt    Hypertension Mother    Alcohol abuse Father    Diabetes Father        meds from liver transplant   Pulmonary embolism Maternal Grandmother    Miscarriages / Stillbirths Paternal Grandmother     Social History Social History   Tobacco Use   Smoking status: Never   Smokeless tobacco: Never  Vaping Use   Vaping status: Never Used  Substance Use Topics   Alcohol use: No   Drug use: No     Allergies   Cat dander   Review of Systems Review of Systems  Constitutional:  Negative for fever.  HENT:  Positive for congestion, ear pain, hearing loss, rhinorrhea and sore throat.   Respiratory:  Positive for cough. Negative for shortness of breath and wheezing.      Physical Exam Triage Vital Signs ED Triage Vitals  Encounter Vitals Group     BP      Girls Systolic BP Percentile      Girls Diastolic BP Percentile      Boys Systolic BP Percentile      Boys Diastolic BP Percentile      Pulse      Resp      Temp      Temp src      SpO2      Weight      Height      Head Circumference      Peak Flow      Pain Score      Pain Loc      Pain Education      Exclude from Growth Chart    No data found.  Updated Vital Signs BP 117/80 (BP Location: Right Arm)   Pulse 71   Temp 98.7 F (37.1 C) (Oral)   Resp 16   Wt 186 lb 4.8 oz (84.5 kg)   SpO2 98%   BMI 36.38 kg/m   Visual Acuity Right Eye Distance:   Left Eye Distance:   Bilateral Distance:    Right Eye Near:   Left Eye Near:    Bilateral Near:     Physical Exam Vitals and nursing note reviewed.  Constitutional:      Appearance: Normal appearance. She is not ill-appearing.  HENT:     Head: Normocephalic and atraumatic.     Right Ear: Tympanic membrane, ear canal and external ear normal. There is no impacted cerumen.     Left Ear: Ear canal and external ear normal. There is no  impacted cerumen.     Ears:     Comments: Left TM is erythematous.  Right TM is pearly gray appearance.  Both  EACs are clear.    Nose: Congestion and rhinorrhea present.     Comments: Please mucosa is edematous and erythematous with clear discharge in both nares.    Mouth/Throat:     Mouth: Mucous membranes are moist.     Pharynx: Oropharynx is clear. Posterior oropharyngeal erythema present. No oropharyngeal exudate.     Comments: Posterior pharynx demonstrates marked swelling on the right-hand side.  No significant erythema or exudate noted. Neck:     Comments: Get fullness behind the right angle of the jaw.  No appreciable cervical lymphadenopathy. Cardiovascular:     Rate and Rhythm: Normal rate and regular rhythm.     Pulses: Normal pulses.     Heart sounds: Normal heart sounds. No murmur heard.    No friction rub. No gallop.  Pulmonary:     Effort: Pulmonary effort is normal.     Breath sounds: Normal breath sounds. No wheezing, rhonchi or rales.  Musculoskeletal:     Cervical back: Normal range of motion and neck supple. Tenderness present.  Lymphadenopathy:     Cervical: No cervical adenopathy.  Skin:    General: Skin is warm and dry.     Capillary Refill: Capillary refill takes less than 2 seconds.     Findings: No rash.  Neurological:     General: No focal deficit present.     Mental Status: She is alert and oriented to person, place, and time.      UC Treatments / Results  Labs (all labs ordered are listed, but only abnormal results are displayed) Labs Reviewed - No data to display  EKG   Radiology No results found.  Procedures Procedures (including critical care time)  Medications Ordered in UC Medications - No data to display  Initial Impression / Assessment and Plan / UC Course  I have reviewed the triage vital signs and the nursing notes.  Pertinent labs & imaging results that were available during my care of the patient were reviewed by me and  considered in my medical decision making (see chart for details).   Patient is a pleasant, nontoxic-appearing 33 year old female presenting for evaluation of respiratory symptoms as outlined HPI above.  The patient reports that on the onset of her symptoms she had a sore throat but her sore throat has resolved.  However, on physical exam she has marked edema to the posterior oropharynx on the right as well as fullness behind the right angle of the jaw which is concerning for possible peritonsillar abscess.  As you can see the image above, there is erythema to the uvula and the right peritonsillar region is approaching the midline.  For this reason, I am going to refer the patient to the emergency department for imaging to determine if she does not fact have a peritonsillar abscess as well as definitive treatment by an ENT specialist.  She has elected to go to [facility].   Final Clinical Impressions(s) / UC Diagnoses   Final diagnoses:  URI with cough and congestion  Non-recurrent acute suppurative otitis media of left ear without spontaneous rupture of tympanic membrane     Discharge Instructions      Please go to the emergency department at [facility] for evaluation of a possible peritonsillar abscess.  The address is [address] [place], [place].  Please go now.     ED Prescriptions   None    PDMP not reviewed this encounter.   Bernardino Ditch, NP 01/16/24 [phone]

## 2024-01-16 NOTE — ED Notes (Signed)
 Patient is being discharged from the Urgent Care and sent to the Emergency Department via personal vehicle . Per Harriette Motto NP, patient is in need of higher level of care due to peritonsillar abscess . Patient is aware and verbalizes understanding of plan of care.  Vitals:   01/16/24 1520  BP: 117/80  Pulse: 71  Resp: 16  Temp: 98.7 F (37.1 C)  SpO2: 98%

## 2024-01-16 NOTE — Discharge Instructions (Addendum)
 Please go to the emergency department at [facility] for evaluation of a possible peritonsillar abscess.  The address is [address] [place], [place].  Please go now.

## 2024-01-19 ENCOUNTER — Ambulatory Visit
Admission: RE | Admit: 2024-01-19 | Discharge: 2024-01-19 | Disposition: A | Discharge status: Home / Self Care | Source: Ambulatory Visit | Attending: Unknown Physician Specialty | Admitting: Unknown Physician Specialty

## 2024-01-19 ENCOUNTER — Other Ambulatory Visit: Payer: Self-pay | Admitting: Unknown Physician Specialty

## 2024-01-19 ENCOUNTER — Encounter: Payer: Self-pay | Admitting: Unknown Physician Specialty

## 2024-01-19 DIAGNOSIS — D104 Benign neoplasm of tonsil: Secondary | ICD-10-CM

## 2024-01-19 DIAGNOSIS — J351 Hypertrophy of tonsils: Secondary | ICD-10-CM

## 2024-01-19 MED ORDER — IOPAMIDOL (ISOVUE-300) INJECTION 61%
500.0000 mL | Freq: Once | INTRAVENOUS | Status: AC | PRN
  Administered 2024-01-19: 75 mL via INTRAVENOUS
